# Patient Record
Sex: Female | Born: 1955
Health system: Southern US, Community
[De-identification: ages and names within clinical notes are randomized; demographics above are authoritative.]

## PROBLEM LIST (undated history)

## (undated) DIAGNOSIS — E7800 Pure hypercholesterolemia, unspecified: Secondary | ICD-10-CM

## (undated) DIAGNOSIS — Z9289 Personal history of other medical treatment: Secondary | ICD-10-CM

## (undated) DIAGNOSIS — I10 Essential (primary) hypertension: Secondary | ICD-10-CM

## (undated) HISTORY — PX: EYE SURGERY: SHX253

---

## 1998-02-09 ENCOUNTER — Ambulatory Visit (HOSPITAL_COMMUNITY): Admission: RE | Admit: 1998-02-09 | Discharge: 1998-02-09 | Payer: Self-pay | Admitting: Gynecology

## 1998-02-09 ENCOUNTER — Other Ambulatory Visit: Admission: RE | Admit: 1998-02-09 | Discharge: 1998-02-09 | Payer: Self-pay | Admitting: Gynecology

## 2002-05-27 ENCOUNTER — Other Ambulatory Visit: Admission: RE | Admit: 2002-05-27 | Discharge: 2002-05-27 | Payer: Self-pay | Admitting: Gynecology

## 2003-03-07 ENCOUNTER — Emergency Department (HOSPITAL_COMMUNITY): Admission: AD | Admit: 2003-03-07 | Discharge: 2003-03-07 | Payer: Self-pay | Admitting: Emergency Medicine

## 2005-01-07 ENCOUNTER — Encounter: Admission: RE | Admit: 2005-01-07 | Discharge: 2005-01-07 | Payer: Self-pay | Admitting: Family Medicine

## 2008-03-28 ENCOUNTER — Encounter: Admission: RE | Admit: 2008-03-28 | Discharge: 2008-03-28 | Payer: Self-pay | Admitting: Family Medicine

## 2010-08-25 ENCOUNTER — Encounter: Payer: Self-pay | Admitting: Family Medicine

## 2010-08-26 ENCOUNTER — Encounter: Payer: Self-pay | Admitting: Family Medicine

## 2010-12-11 ENCOUNTER — Other Ambulatory Visit (HOSPITAL_COMMUNITY): Payer: Self-pay | Admitting: Physician Assistant

## 2010-12-11 ENCOUNTER — Ambulatory Visit (HOSPITAL_COMMUNITY)
Admission: RE | Admit: 2010-12-11 | Discharge: 2010-12-11 | Disposition: A | Discharge status: Home / Self Care | Payer: 59 | Source: Ambulatory Visit | Attending: Physician Assistant | Admitting: Physician Assistant

## 2010-12-11 ENCOUNTER — Ambulatory Visit (HOSPITAL_COMMUNITY)
Admission: RE | Admit: 2010-12-11 | Discharge: 2010-12-11 | Disposition: A | Discharge status: Home / Self Care | Payer: 59 | Source: Ambulatory Visit | Attending: Family Medicine | Admitting: Family Medicine

## 2010-12-11 DIAGNOSIS — R05 Cough: Secondary | ICD-10-CM

## 2010-12-11 DIAGNOSIS — J42 Unspecified chronic bronchitis: Secondary | ICD-10-CM | POA: Insufficient documentation

## 2011-10-02 ENCOUNTER — Other Ambulatory Visit: Payer: Self-pay | Admitting: Family Medicine

## 2011-10-02 DIAGNOSIS — Z1231 Encounter for screening mammogram for malignant neoplasm of breast: Secondary | ICD-10-CM

## 2011-10-08 ENCOUNTER — Other Ambulatory Visit: Payer: Self-pay | Admitting: Family Medicine

## 2011-10-08 DIAGNOSIS — Z780 Asymptomatic menopausal state: Secondary | ICD-10-CM

## 2011-10-08 DIAGNOSIS — N951 Menopausal and female climacteric states: Secondary | ICD-10-CM

## 2011-10-15 ENCOUNTER — Ambulatory Visit
Admission: RE | Admit: 2011-10-15 | Discharge: 2011-10-15 | Disposition: A | Discharge status: Home / Self Care | Payer: 59 | Source: Ambulatory Visit | Attending: Family Medicine | Admitting: Family Medicine

## 2011-10-15 DIAGNOSIS — Z1231 Encounter for screening mammogram for malignant neoplasm of breast: Secondary | ICD-10-CM

## 2011-10-29 ENCOUNTER — Ambulatory Visit
Admission: RE | Admit: 2011-10-29 | Discharge: 2011-10-29 | Disposition: A | Discharge status: Home / Self Care | Payer: 59 | Source: Ambulatory Visit | Attending: Family Medicine | Admitting: Family Medicine

## 2011-10-29 DIAGNOSIS — N951 Menopausal and female climacteric states: Secondary | ICD-10-CM

## 2012-09-24 ENCOUNTER — Other Ambulatory Visit (HOSPITAL_COMMUNITY): Payer: Self-pay | Admitting: Family Medicine

## 2012-10-30 ENCOUNTER — Other Ambulatory Visit: Payer: Self-pay | Admitting: Family Medicine

## 2012-10-30 DIAGNOSIS — Z1231 Encounter for screening mammogram for malignant neoplasm of breast: Secondary | ICD-10-CM

## 2012-11-16 ENCOUNTER — Ambulatory Visit
Admission: RE | Admit: 2012-11-16 | Discharge: 2012-11-16 | Disposition: A | Discharge status: Home / Self Care | Payer: BC Managed Care – PPO | Source: Ambulatory Visit | Attending: Family Medicine | Admitting: Family Medicine

## 2012-11-16 DIAGNOSIS — Z1231 Encounter for screening mammogram for malignant neoplasm of breast: Secondary | ICD-10-CM

## 2013-10-12 ENCOUNTER — Other Ambulatory Visit: Payer: Self-pay

## 2013-10-12 DIAGNOSIS — Z1231 Encounter for screening mammogram for malignant neoplasm of breast: Secondary | ICD-10-CM

## 2013-11-18 ENCOUNTER — Ambulatory Visit
Admission: RE | Admit: 2013-11-18 | Discharge: 2013-11-18 | Disposition: A | Discharge status: Home / Self Care | Payer: BC Managed Care – PPO | Source: Ambulatory Visit

## 2013-11-18 DIAGNOSIS — Z1231 Encounter for screening mammogram for malignant neoplasm of breast: Secondary | ICD-10-CM

## 2014-10-21 ENCOUNTER — Other Ambulatory Visit: Payer: Self-pay

## 2014-10-21 DIAGNOSIS — Z1231 Encounter for screening mammogram for malignant neoplasm of breast: Secondary | ICD-10-CM

## 2014-11-21 ENCOUNTER — Ambulatory Visit
Admission: RE | Admit: 2014-11-21 | Discharge: 2014-11-21 | Disposition: A | Discharge status: Home / Self Care | Payer: BLUE CROSS/BLUE SHIELD | Source: Ambulatory Visit

## 2014-11-21 DIAGNOSIS — Z1231 Encounter for screening mammogram for malignant neoplasm of breast: Secondary | ICD-10-CM

## 2015-10-19 ENCOUNTER — Other Ambulatory Visit: Payer: Self-pay | Admitting: Family Medicine

## 2015-10-19 DIAGNOSIS — Z87891 Personal history of nicotine dependence: Secondary | ICD-10-CM

## 2015-11-27 ENCOUNTER — Ambulatory Visit
Admission: RE | Admit: 2015-11-27 | Discharge: 2015-11-27 | Disposition: A | Discharge status: Home / Self Care | Payer: BLUE CROSS/BLUE SHIELD | Source: Ambulatory Visit | Attending: Family Medicine | Admitting: Family Medicine

## 2015-11-27 DIAGNOSIS — Z87891 Personal history of nicotine dependence: Secondary | ICD-10-CM

## 2015-12-01 ENCOUNTER — Telehealth: Payer: Self-pay | Admitting: Cardiology

## 2015-12-01 ENCOUNTER — Telehealth: Payer: Self-pay | Admitting: Cardiovascular Disease

## 2015-12-01 NOTE — Telephone Encounter (Signed)
[facility] 12/11/15 with Dr SwazilandJordan.  [facility] Dr Elvis CoilJordan's schedule on 12/11/15. lp

## 2015-12-11 ENCOUNTER — Ambulatory Visit: Payer: BLUE CROSS/BLUE SHIELD | Admitting: Cardiology

## 2016-01-29 ENCOUNTER — Ambulatory Visit: Payer: BLUE CROSS/BLUE SHIELD | Admitting: Cardiology

## 2016-10-23 ENCOUNTER — Other Ambulatory Visit: Payer: Self-pay | Admitting: Physician Assistant

## 2016-10-23 DIAGNOSIS — R918 Other nonspecific abnormal finding of lung field: Secondary | ICD-10-CM

## 2017-04-09 ENCOUNTER — Other Ambulatory Visit: Payer: Self-pay | Admitting: Physician Assistant

## 2017-04-09 DIAGNOSIS — Z1231 Encounter for screening mammogram for malignant neoplasm of breast: Secondary | ICD-10-CM

## 2017-04-23 ENCOUNTER — Ambulatory Visit: Payer: BLUE CROSS/BLUE SHIELD

## 2018-01-15 ENCOUNTER — Other Ambulatory Visit: Payer: Self-pay | Admitting: Nurse Practitioner

## 2018-01-15 DIAGNOSIS — Z1231 Encounter for screening mammogram for malignant neoplasm of breast: Secondary | ICD-10-CM

## 2018-02-04 ENCOUNTER — Ambulatory Visit
Admission: RE | Admit: 2018-02-04 | Discharge: 2018-02-04 | Disposition: A | Discharge status: Home / Self Care | Payer: BLUE CROSS/BLUE SHIELD | Source: Ambulatory Visit | Attending: Nurse Practitioner | Admitting: Nurse Practitioner

## 2018-02-04 DIAGNOSIS — Z1231 Encounter for screening mammogram for malignant neoplasm of breast: Secondary | ICD-10-CM

## 2018-02-07 ENCOUNTER — Emergency Department: Payer: BLUE CROSS/BLUE SHIELD

## 2018-02-07 ENCOUNTER — Encounter: Payer: Self-pay | Admitting: Emergency Medicine

## 2018-02-07 ENCOUNTER — Inpatient Hospital Stay
Admission: EM | Admit: 2018-02-07 | Discharge: 2018-02-09 | DRG: 494 | Disposition: A | Discharge status: Home Health | Payer: BLUE CROSS/BLUE SHIELD | Attending: Orthopedic Surgery | Admitting: Orthopedic Surgery

## 2018-02-07 DIAGNOSIS — I10 Essential (primary) hypertension: Secondary | ICD-10-CM | POA: Diagnosis present

## 2018-02-07 DIAGNOSIS — S82872A Displaced pilon fracture of left tibia, initial encounter for closed fracture: Secondary | ICD-10-CM | POA: Diagnosis present

## 2018-02-07 DIAGNOSIS — W1842XA Slipping, tripping and stumbling without falling due to stepping into hole or opening, initial encounter: Secondary | ICD-10-CM | POA: Diagnosis present

## 2018-02-07 DIAGNOSIS — S82892A Other fracture of left lower leg, initial encounter for closed fracture: Secondary | ICD-10-CM

## 2018-02-07 DIAGNOSIS — Z79899 Other long term (current) drug therapy: Secondary | ICD-10-CM | POA: Diagnosis not present

## 2018-02-07 DIAGNOSIS — S82899A Other fracture of unspecified lower leg, initial encounter for closed fracture: Secondary | ICD-10-CM | POA: Diagnosis present

## 2018-02-07 DIAGNOSIS — F17200 Nicotine dependence, unspecified, uncomplicated: Secondary | ICD-10-CM | POA: Diagnosis present

## 2018-02-07 DIAGNOSIS — Z8673 Personal history of transient ischemic attack (TIA), and cerebral infarction without residual deficits: Secondary | ICD-10-CM | POA: Diagnosis not present

## 2018-02-07 DIAGNOSIS — E7800 Pure hypercholesterolemia, unspecified: Secondary | ICD-10-CM | POA: Diagnosis present

## 2018-02-07 DIAGNOSIS — J449 Chronic obstructive pulmonary disease, unspecified: Secondary | ICD-10-CM | POA: Diagnosis present

## 2018-02-07 DIAGNOSIS — S82852A Displaced trimalleolar fracture of left lower leg, initial encounter for closed fracture: Principal | ICD-10-CM | POA: Diagnosis present

## 2018-02-07 DIAGNOSIS — T148XXA Other injury of unspecified body region, initial encounter: Secondary | ICD-10-CM

## 2018-02-07 HISTORY — DX: Pure hypercholesterolemia, unspecified: E78.00

## 2018-02-07 HISTORY — DX: Essential (primary) hypertension: I10

## 2018-02-07 MED ORDER — ETOMIDATE 2 MG/ML IV SOLN
INTRAVENOUS | Status: AC | PRN
  Administered 2018-02-07 (×2): 6 mg via INTRAVENOUS

## 2018-02-07 MED ORDER — ETOMIDATE 2 MG/ML IV SOLN
INTRAVENOUS | Status: AC
  Filled 2018-02-07: qty 10

## 2018-02-07 MED ORDER — ONDANSETRON HCL 4 MG/2ML IJ SOLN
INTRAMUSCULAR | Status: AC
  Administered 2018-02-07: 4 mg
  Filled 2018-02-07: qty 2

## 2018-02-07 NOTE — ED Provider Notes (Signed)
.Sedation Date/Time: 02/07/2018 11:32 PM Performed by: Rebecka ApleyWebster, Graylee Arutyunyan P, MD Authorized by: Rebecka ApleyWebster, Korissa Horsford P, MD   Consent:    Consent obtained:  Written (electronic informed consent)   Consent given by:  Patient   Risks discussed:  Allergic reaction, dysrhythmia, inadequate sedation, nausea, vomiting, respiratory compromise necessitating ventilatory assistance and intubation, prolonged sedation necessitating reversal and prolonged hypoxia resulting in organ damage Universal protocol:    Procedure explained and questions answered to patient or proxy's satisfaction: yes     Relevant documents present and verified: yes     Test results available and properly labeled: yes     Imaging studies available: yes     Required blood products, implants, devices, and special equipment available: yes     Immediately prior to procedure a time out was called: yes     Patient identity confirmation method:  Arm band and verbally with patient Indications:    Procedure performed:  Dislocation reduction   Procedure necessitating sedation performed by: physician assistant.   Intended level of sedation:  Moderate (conscious sedation) Pre-sedation assessment:    Time since last food or drink:  2200   NPO status caution: urgency dictates proceeding with non-ideal NPO status     ASA classification: class 2 - patient with mild systemic disease     Neck mobility: normal     Mouth opening:  3 or more finger widths   Thyromental distance:  3 finger widths   Mallampati score:  II - soft palate, uvula, fauces visible   Pre-sedation assessments completed and reviewed: airway patency, cardiovascular function, mental status, pain level and respiratory function     Pre-sedation assessment completed:  02/07/2018 11:20 PM Immediate pre-procedure details:    Reassessment: Patient reassessed immediately prior to procedure     Reviewed: vital signs, relevant labs/tests and NPO status     Verified: bag valve mask available,  emergency equipment available, intubation equipment available, IV patency confirmed, oxygen available, reversal medications available and suction available   Procedure details (see MAR for exact dosages):    Preoxygenation:  Nasal cannula   Sedation:  Etomidate   Intra-procedure monitoring:  Blood pressure monitoring, continuous pulse oximetry, cardiac monitor, frequent vital sign checks, frequent LOC assessments and continuous capnometry   Intra-procedure events: none     Total Provider sedation time (minutes):  10 Post-procedure details:    Post-sedation assessment completed:  02/08/2018 12:12 AM   Attendance: Constant attendance by certified staff until patient recovered     Recovery: Patient returned to pre-procedure baseline     Post-sedation assessments completed and reviewed: airway patency, cardiovascular function, hydration status, mental status and respiratory function     Patient is stable for discharge or admission: yes     Patient tolerance:  Tolerated well, no immediate complications Reduction of dislocation Date/Time: 02/07/2018 11:32 PM Performed by: Racheal Patchesuthriell, Jonathan D, PA-C Authorized by: Rebecka ApleyWebster, Kobe Ofallon P, MD  Consent: Verbal consent obtained. Written consent obtained. Risks and benefits: risks, benefits and alternatives were discussed Consent given by: patient Patient understanding: patient states understanding of the procedure being performed Patient consent: the patient's understanding of the procedure matches consent given Procedure consent: procedure consent matches procedure scheduled Relevant documents: relevant documents present and verified Test results: test results available and properly labeled Site marked: the operative site was marked Imaging studies: imaging studies available Patient identity confirmed: verbally with patient and arm band Local anesthesia used: no  Anesthesia: Local anesthesia used: no  Sedation: Patient sedated: yes Sedation type:  moderate (conscious) sedation Sedatives: etomidate Sedation start date/time: 02/07/2018 11:32 PM Sedation end date/time: 02/07/2018 11:45 PM Vitals: Vital signs were monitored during sedation.  Patient tolerance: Patient tolerated the procedure well with no immediate complications    Medical screening examination/treatment/procedure(s) were conducted as a shared visit with non-physician practitioner(s) and myself.  I personally evaluated the patient during the encounter.     Rebecka Apley, MD 02/08/18 [phone]

## 2018-02-07 NOTE — ED Triage Notes (Signed)
Patient states that she stepped in a hole and twisted her left ankle. Patient with pain and swelling to left ankle.

## 2018-02-07 NOTE — ED Provider Notes (Signed)
[facility] Emergency Department Provider Note  ____________________________________________  Time seen: Approximately 10:39 PM  I have reviewed the triage vital signs and the nursing notes.   HISTORY  Chief Complaint Ankle Pain    HPI Emily Bishop is a 62 y.o. female who presents the emergency department complaining of left ankle pain and injury.  Patient reports she stepped either into a hole or caught her sandal on an object, twisting it causing her to fall.  Patient has not been able to bear weight on the ankle since time of injury.  She reports that she has also been unable to move her ankle since time of injury.  She endorses sensation to her toes, but states that they feel cold.  She reports gross swelling to the ankle.  No previous history of injury or surgery to the ankle.  Patient did not hit her head or lose consciousness.  Patient reports that while painful while not moving her ankle it is relatively pain-free.   She has not taken any medications including Tylenol or Motrin prior to arrival.  Patient reports that she had been consuming alcohol and had eaten approximately 2 hours prior to injury.  Patient continued to drink nonalcoholic beverages up to the time of injury.  Patient has a history of hypercholesterolemia, hypertension, COPD.  She reports that these are all well managed with primary care.   Past Medical History:  Diagnosis Date  . Hypercholesteremia   . Hypertension     Patient Active Problem List   Diagnosis Date Noted  . Ankle fracture 02/07/2018    History reviewed. No pertinent surgical history.  Prior to Admission medications   Not on File    Allergies Patient has no known allergies.  No family history on file.  Social History Social History   Tobacco Use  . Smoking status: Current Every Day Smoker  . Smokeless tobacco: Never Used  Substance Use Topics  . Alcohol use: Not on file  . Drug use: Not on file      Review of Systems  Constitutional: No fever/chills Eyes: No visual changes. Cardiovascular: no chest pain. Respiratory: no cough. No SOB. Gastrointestinal: No abdominal pain.  No nausea, no vomiting.   Musculoskeletal: Positive for left ankle pain and injury Skin: Negative for rash, abrasions, lacerations, ecchymosis. Neurological: Negative for headaches, focal weakness or numbness. 10-point ROS otherwise negative.  ____________________________________________   PHYSICAL EXAM:  VITAL SIGNS: ED Triage Vitals  Enc Vitals Group     BP 02/07/18 2231 (!) 153/85     Pulse Rate 02/07/18 2231 (!) 108     Resp 02/07/18 2231 18     Temp 02/07/18 2231 98.3 F (36.8 C)     Temp Source 02/07/18 2231 Oral     SpO2 02/07/18 2231 96 %     Weight 02/07/18 2232 145 lb (65.8 kg)     Height 02/07/18 2232 5' 6.5" (1.689 m)     Head Circumference --      Peak Flow --      Pain Score 02/07/18 2232 8     Pain Loc --      Pain Edu? --      Excl. in GC? --      Constitutional: Alert and oriented. Well appearing and in no acute distress. Eyes: Conjunctivae are normal. PERRL. EOMI. Head: Atraumatic. Neck: No stridor.    Cardiovascular: Normal rate, regular rhythm. Normal S1 and S2.  Good peripheral circulation. Respiratory: Normal respiratory effort  without tachypnea or retractions. Lungs CTAB. Good air entry to the bases with no decreased or absent breath sounds. Musculoskeletal: Full range of motion to all extremities. No gross deformities appreciated.  Visualization of the left ankle reveals gross deformity to the lateral and anterior ankle.  Patient with no range of motion to the ankle at this time.  Patient is exquisitely tender to palpation with palpable deformity to the anterior lateral aspect of the ankle.  Dorsalis pedis pulse still intact.  Is reduced to all 5 digits.  Patient's distal foot is slightly dusky.  From mid metatarsal region distally, area is cool to touch.  Sensation  intact all digits. Neurologic:  Normal speech and language. No gross focal neurologic deficits are appreciated.  Skin:  Skin is warm, dry and intact. No rash noted. Psychiatric: Mood and affect are normal. Speech and behavior are normal. Patient exhibits appropriate insight and judgement.   ____________________________________________   LABS (all labs ordered are listed, but only abnormal results are displayed)  Labs Reviewed  COMPREHENSIVE METABOLIC PANEL  CBC WITH DIFFERENTIAL/PLATELET  PROTIME-INR  APTT   ____________________________________________  EKG   ____________________________________________  RADIOLOGY I personally viewed and evaluated these images as part of my medical decision making, as well as reviewing the written report by the radiologist.  Patient with acute, displaced and angulated fracture of the distal fibula with posterior malleolar fracture as well.  Dislocation of the talar dome.  Dg Chest 1 View  Result Date: 02/08/2018 CLINICAL DATA:  Initial preoperative evaluation for left ankle ORIF. EXAM: CHEST  1 VIEW COMPARISON:  Prior CT from 11/27/2015. FINDINGS: Cardiac and mediastinal silhouettes are within normal limits. Aortic atherosclerosis noted. Lungs are well inflated. No focal infiltrates. No pulmonary edema or pleural effusion. No pneumothorax. No acute osseous abnormality. IMPRESSION: 1. No active cardiopulmonary disease. 2. Aortic atherosclerosis. Electronically Signed   By: Rise MuBenjamin  McClintock M.D.   On: 02/08/2018 00:00   Dg Ankle Complete Left  Result Date: 02/07/2018 CLINICAL DATA:  Follow-up examination status post splinting and reduction. EXAM: LEFT ANKLE COMPLETE - 3+ VIEW COMPARISON:  Prior radiograph from earlier the same day. FINDINGS: Splinting material overlies the left ankle, somewhat limiting evaluation for fine osseous detail. Previously seen fracture/dislocation of the left ankle has been reduced. Distal left fibular fracture in near  anatomic alignment. Persistent slight posterior and superior displacement of a posterior malleolar fracture. Tibiotalar joint now in anatomic alignment. Ankle mortise is approximated. No other new osseous abnormality about the ankle. IMPRESSION: 1. Left ankle joint in gross anatomic alignment status post splinting and reduction. 2. Distal left fibular fracture in anatomic alignment. 3. Persistent slight posterior and superior displacement of a posterior malleolar fracture. Electronically Signed   By: Rise MuBenjamin  McClintock M.D.   On: 02/07/2018 23:57   Dg Ankle Complete Left  Result Date: 02/07/2018 CLINICAL DATA:  Stepped in hole EXAM: LEFT ANKLE COMPLETE - 3+ VIEW COMPARISON:  None. FINDINGS: Acute fracture distal shaft and metaphysis of the fibula with 1 bone with of lateral and posterior displacement of distal fracture fragment. Mild to moderate lateral angulation of distal fracture fragment. Posterior dislocation of the talus with respect to the distal tibia and fibula. Suspicion of posterior malleolar fracture. Widening of the anterior joint. Small plantar calcaneal spur. IMPRESSION: 1. Acute displaced and angulated fracture of the distal fibula 2. Posterior dislocation of the talar dome with respect to the distal tibia and fibula. Suspect that there is displaced posterior malleolar fracture. Electronically Signed   By:  Jasmine Pang M.D.   On: 02/07/2018 22:49    ____________________________________________    PROCEDURES  Procedure(s) performed:    Reduction of fracture Date/Time: 02/07/2018 11:57 PM Performed by: Racheal Patches, PA-C Authorized by: Racheal Patches, PA-C  Consent: Verbal consent obtained. Written consent obtained. Risks and benefits: risks, benefits and alternatives were discussed Consent given by: patient Patient understanding: patient states understanding of the procedure being performed Patient consent: the patient's understanding of the procedure matches  consent given Imaging studies: imaging studies available Required items: required blood products, implants, devices, and special equipment available Patient identity confirmed: verbally with patient Time out: Immediately prior to procedure a "time out" was called to verify the correct patient, procedure, equipment, support staff and site/side marked as required. Local anesthesia used: no  Anesthesia: Local anesthesia used: no  Sedation: Patient sedated: yes Sedation type: moderate (conscious) sedation Sedatives: etomidate Vitals: Vital signs were monitored during sedation.  Patient tolerance: Patient tolerated the procedure well with no immediate complications Comments: Reduction is performed by using distal traction to the great toe with manipulation of the calcaneus.  Palpable reduction is appreciated with good visible improvement.  Patient's distal foot and toes immediately started to pink up.  Dorsalis pedis pulse intact.  As patient aroused from sedation, she reports good sensation and improvement of pain and coolness to the foot.  Marland KitchenSplint Application Date/Time: 02/07/2018 11:59 PM Performed by: Racheal Patches, PA-C Authorized by: Racheal Patches, PA-C   Consent:    Consent obtained:  Verbal   Consent given by:  Patient   Risks discussed:  Pain and swelling Pre-procedure details:    Sensation:  Normal   Skin color:  Pink Procedure details:    Laterality:  Left   Location:  Ankle   Ankle:  L ankle   Splint type:  Short leg and ankle stirrup   Supplies:  Cotton padding, Ortho-Glass and elastic bandage Post-procedure details:    Pain:  Improved   Sensation:  Normal   Skin color:  Pink   Patient tolerance of procedure:  Tolerated well, no immediate complications      Medications  etomidate (AMIDATE) injection (6 mg Intravenous Given 02/07/18 2337)  ondansetron (ZOFRAN) 4 MG/2ML injection (4 mg  Given 02/07/18 2345)      ____________________________________________   INITIAL IMPRESSION / ASSESSMENT AND PLAN / ED COURSE  Pertinent labs & imaging results that were available during my care of the patient were reviewed by me and considered in my medical decision making (see chart for details).  Review of the [place] CSRS was performed in accordance of the [facility] prior to dispensing any controlled drugs.  Clinical Course as of Feb 08 6  Sat Feb 07, 2018  2355 Patient resented to the emergency department with obvious left ankle injury.  Patient reports that she either stepped into a hole or caught her sandal causing her to twist her ankle.  Nonweightbearing since injury.  On exam, grossly deformed ankle injury was appreciated with palpable abnormalities.  Patient had duskiness of the foot extending past the mid metatarsal region.  Capillary refill is less than 4 seconds.  Foot and toes to the distal aspect were cool to the touch.  Sensation intact.  Patient had consumed alcohol but appeared competent to make decisions for herself.  She did not hit her head or lose consciousness.  Initial x-ray reveals trimalleolar fracture with talar dome dislocation.  I discussed the case with on-call orthopedic surgeon, Dr. Allena Katz who  recommends closed reduction tonight, splint with posterior OCL and stirrup ankle splint.  CT after reduction and surgery in the morning.  Accordingly, patient is moved to the major side emergency department for conscious sedation.  Dr. Zenda Alpers performs conscious sedation while I performed reduction of the fracture dislocation.  Patient tolerated well.  See Dr. Leone Payor note for procedure details.   [JC]    Clinical Course User Index [JC] Cuthriell, Delorise Royals, PA-C     Patient's diagnosis is consistent with trimalleolar fracture of the left ankle.  Patient presented with obviously deformed left ankle injury.  X-ray revealed trimalleolar fracture with dislocation of the talar dome.  Patient was moved  to the major side of the emergency department for closer monitoring during procedural sedation for reduction.  This was performed with good results.  Patient is neurovascularly intact status post reduction.  Patient did have some mild compromise of circulation prior to dislocation with a cat refill of 4 seconds, dusky foot.  This immediately improved after reduction.  Patient also reports good symptomatic improvement as well.  I discussed the case with on-call orthopedic surgeon who advises that he will perform surgery tomorrow.  In the interim, patient is to have basic blood work, chest x-ray, CT of the left ankle for further evaluation postreduction.  After reduction, patient care is turned over to Dr. Zenda Alpers until patient is admitted.   ____________________________________________  FINAL CLINICAL IMPRESSION(S) / ED DIAGNOSES  Final diagnoses:  Closed trimalleolar fracture of left ankle, initial encounter      NEW MEDICATIONS STARTED DURING THIS VISIT:  ED Discharge Orders    None          This chart was dictated using voice recognition software/Dragon. Despite best efforts to proofread, errors can occur which can change the meaning. Any change was purely unintentional.    Racheal Patches, PA-C 02/08/18 0007    Rebecka Apley, MD 02/08/18 [phone]

## 2018-02-08 ENCOUNTER — Encounter: Payer: Self-pay | Admitting: Anesthesiology

## 2018-02-08 ENCOUNTER — Inpatient Hospital Stay: Payer: BLUE CROSS/BLUE SHIELD

## 2018-02-08 ENCOUNTER — Encounter
Admission: EM | Disposition: A | Discharge status: Home Health | Payer: Self-pay | Source: Home / Self Care | Attending: Orthopedic Surgery

## 2018-02-08 ENCOUNTER — Other Ambulatory Visit: Payer: Self-pay

## 2018-02-08 ENCOUNTER — Inpatient Hospital Stay: Payer: BLUE CROSS/BLUE SHIELD | Admitting: Anesthesiology

## 2018-02-08 HISTORY — PX: ORIF ANKLE FRACTURE: SHX5408

## 2018-02-08 LAB — COMPREHENSIVE METABOLIC PANEL
ALT: 16 U/L (ref 0–44)
AST: 24 U/L (ref 15–41)
Albumin: 3.6 g/dL (ref 3.5–5.0)
Alkaline Phosphatase: 61 U/L (ref 38–126)
Anion gap: 10 (ref 5–15)
BILIRUBIN TOTAL: 0.4 mg/dL (ref 0.3–1.2)
CO2: 21 mmol/L — ABNORMAL LOW (ref 22–32)
CREATININE: 0.59 mg/dL (ref 0.44–1.00)
Calcium: 8.3 mg/dL — ABNORMAL LOW (ref 8.9–10.3)
Chloride: 101 mmol/L (ref 98–111)
GFR calc Af Amer: >60 mL/min (ref >60)
Glucose, Bld: 104 mg/dL — ABNORMAL HIGH (ref 70–99)
Potassium: 3.7 mmol/L (ref 3.5–5.1)
Sodium: 132 mmol/L — ABNORMAL LOW (ref 135–145)
TOTAL PROTEIN: 6 g/dL — AB (ref 6.5–8.1)

## 2018-02-08 LAB — CBC WITH DIFFERENTIAL/PLATELET
BASOS ABS: 0 10*3/uL (ref 0–0.1)
Basophils Relative: 1 %
EOS ABS: 0 10*3/uL (ref 0–0.7)
EOS PCT: 0 %
HCT: 43 % (ref 35.0–47.0)
Hemoglobin: 15.3 g/dL (ref 12.0–16.0)
Lymphocytes Relative: 22 %
Lymphs Abs: 1.6 10*3/uL (ref 1.0–3.6)
MCH: 35.2 pg — AB (ref 26.0–34.0)
MCHC: 35.5 g/dL (ref 32.0–36.0)
MCV: 99 fL (ref 80.0–100.0)
Monocytes Absolute: 0.3 10*3/uL (ref 0.2–0.9)
Monocytes Relative: 5 %
Neutro Abs: 5.3 10*3/uL (ref 1.4–6.5)
Neutrophils Relative %: 72 %
PLATELETS: 175 10*3/uL (ref 150–440)
RBC: 4.34 MIL/uL (ref 3.80–5.20)
RDW: 14.4 % (ref 11.5–14.5)
WBC: 7.3 10*3/uL (ref 3.6–11.0)

## 2018-02-08 LAB — MRSA PCR SCREENING: MRSA by PCR: NEGATIVE

## 2018-02-08 LAB — PROTIME-INR
INR: 0.87
PROTHROMBIN TIME: 11.7 s (ref 11.4–15.2)

## 2018-02-08 LAB — APTT: aPTT: 30 seconds (ref 24–36)

## 2018-02-08 SURGERY — OPEN REDUCTION INTERNAL FIXATION (ORIF) ANKLE FRACTURE
Anesthesia: General | Site: Ankle | Laterality: Left | Wound class: Clean

## 2018-02-08 MED ORDER — DIPHENHYDRAMINE HCL 12.5 MG/5ML PO ELIX: 12.5000 mg | ORAL_SOLUTION | ORAL | Status: DC | PRN

## 2018-02-08 MED ORDER — ACETAMINOPHEN 500 MG PO TABS
1000.0000 mg | ORAL_TABLET | Freq: Three times a day (TID) | ORAL | Status: DC
  Administered 2018-02-08 – 2018-02-09 (×3): 1000 mg via ORAL
  Filled 2018-02-08 (×3): qty 2

## 2018-02-08 MED ORDER — BISACODYL 10 MG RE SUPP: 10.0000 mg | Freq: Every day | RECTAL | Status: DC | PRN

## 2018-02-08 MED ORDER — HYDROMORPHONE HCL 1 MG/ML IJ SOLN: 0.2500 mg | INTRAMUSCULAR | Status: DC | PRN

## 2018-02-08 MED ORDER — GLYCOPYRROLATE 0.2 MG/ML IJ SOLN
INTRAMUSCULAR | Status: AC
  Filled 2018-02-08: qty 1

## 2018-02-08 MED ORDER — ACETAMINOPHEN 500 MG PO TABS
1000.0000 mg | ORAL_TABLET | Freq: Four times a day (QID) | ORAL | Status: DC
  Administered 2018-02-08 (×2): 1000 mg via ORAL
  Filled 2018-02-08 (×2): qty 2

## 2018-02-08 MED ORDER — OXYCODONE HCL 5 MG PO TABS: 5.0000 mg | ORAL_TABLET | Freq: Once | ORAL | Status: DC | PRN

## 2018-02-08 MED ORDER — NEOMYCIN-POLYMYXIN B GU 40-200000 IR SOLN
Status: AC
  Filled 2018-02-08: qty 2

## 2018-02-08 MED ORDER — ONDANSETRON HCL 4 MG/2ML IJ SOLN: 4.0000 mg | Freq: Four times a day (QID) | INTRAMUSCULAR | Status: DC | PRN

## 2018-02-08 MED ORDER — SENNOSIDES-DOCUSATE SODIUM 8.6-50 MG PO TABS: 1.0000 | ORAL_TABLET | Freq: Every evening | ORAL | Status: DC | PRN

## 2018-02-08 MED ORDER — DOCUSATE SODIUM 100 MG PO CAPS
100.0000 mg | ORAL_CAPSULE | Freq: Two times a day (BID) | ORAL | Status: DC
  Administered 2018-02-09: 100 mg via ORAL
  Filled 2018-02-08: qty 1

## 2018-02-08 MED ORDER — DOCUSATE SODIUM 100 MG PO CAPS: 100.0000 mg | ORAL_CAPSULE | Freq: Two times a day (BID) | ORAL | Status: DC

## 2018-02-08 MED ORDER — CEFAZOLIN SODIUM-DEXTROSE 2-3 GM-%(50ML) IV SOLR
INTRAVENOUS | Status: DC | PRN
  Administered 2018-02-08: 2 g via INTRAVENOUS

## 2018-02-08 MED ORDER — FENTANYL CITRATE (PF) 100 MCG/2ML IJ SOLN
INTRAMUSCULAR | Status: AC
  Filled 2018-02-08: qty 2

## 2018-02-08 MED ORDER — METHOCARBAMOL 1000 MG/10ML IJ SOLN
500.0000 mg | Freq: Four times a day (QID) | INTRAVENOUS | Status: DC | PRN
  Filled 2018-02-08: qty 5

## 2018-02-08 MED ORDER — OXYCODONE HCL 5 MG PO TABS
5.0000 mg | ORAL_TABLET | ORAL | Status: DC | PRN
  Administered 2018-02-08 (×2): 10 mg via ORAL
  Filled 2018-02-08 (×2): qty 2

## 2018-02-08 MED ORDER — ONDANSETRON HCL 4 MG/2ML IJ SOLN
INTRAMUSCULAR | Status: AC
  Filled 2018-02-08: qty 2

## 2018-02-08 MED ORDER — ONDANSETRON HCL 4 MG/2ML IJ SOLN
4.0000 mg | Freq: Four times a day (QID) | INTRAMUSCULAR | Status: DC | PRN
  Administered 2018-02-08: 4 mg via INTRAVENOUS

## 2018-02-08 MED ORDER — BUPIVACAINE-EPINEPHRINE (PF) 0.25% -1:200000 IJ SOLN
INTRAMUSCULAR | Status: AC
  Filled 2018-02-08: qty 30

## 2018-02-08 MED ORDER — MIDAZOLAM HCL 2 MG/2ML IJ SOLN
INTRAMUSCULAR | Status: DC | PRN
  Administered 2018-02-08: 2 mg via INTRAVENOUS

## 2018-02-08 MED ORDER — LIDOCAINE HCL (CARDIAC) PF 100 MG/5ML IV SOSY
PREFILLED_SYRINGE | INTRAVENOUS | Status: DC | PRN
  Administered 2018-02-08: 100 mg via INTRAVENOUS

## 2018-02-08 MED ORDER — ACETAMINOPHEN 10 MG/ML IV SOLN
INTRAVENOUS | Status: AC
  Filled 2018-02-08: qty 100

## 2018-02-08 MED ORDER — CEFAZOLIN SODIUM-DEXTROSE 1-4 GM/50ML-% IV SOLN
1.0000 g | Freq: Four times a day (QID) | INTRAVENOUS | Status: AC
  Administered 2018-02-08 – 2018-02-09 (×3): 1 g via INTRAVENOUS
  Filled 2018-02-08 (×3): qty 50

## 2018-02-08 MED ORDER — DEXAMETHASONE SODIUM PHOSPHATE 10 MG/ML IJ SOLN
INTRAMUSCULAR | Status: AC
  Filled 2018-02-08: qty 1

## 2018-02-08 MED ORDER — SODIUM CHLORIDE 0.9 % IR SOLN
Status: DC | PRN
  Administered 2018-02-08: 500 mL

## 2018-02-08 MED ORDER — FENTANYL CITRATE (PF) 100 MCG/2ML IJ SOLN
25.0000 ug | INTRAMUSCULAR | Status: DC | PRN
  Administered 2018-02-08 (×2): 50 ug via INTRAVENOUS

## 2018-02-08 MED ORDER — FENTANYL CITRATE (PF) 100 MCG/2ML IJ SOLN
INTRAMUSCULAR | Status: DC | PRN
  Administered 2018-02-08 (×2): 50 ug via INTRAVENOUS

## 2018-02-08 MED ORDER — PROPOFOL 10 MG/ML IV BOLUS
INTRAVENOUS | Status: DC | PRN
  Administered 2018-02-08: 150 mg via INTRAVENOUS
  Administered 2018-02-08: 50 mg via INTRAVENOUS

## 2018-02-08 MED ORDER — ONDANSETRON HCL 4 MG PO TABS
4.0000 mg | ORAL_TABLET | Freq: Four times a day (QID) | ORAL | Status: DC | PRN
Start: 2018-02-08 — End: 2018-02-09
  Administered 2018-02-09: 4 mg via ORAL
  Filled 2018-02-08: qty 1

## 2018-02-08 MED ORDER — LACTATED RINGERS IV SOLN
INTRAVENOUS | Status: DC | PRN
  Administered 2018-02-08: 10:00:00 via INTRAVENOUS

## 2018-02-08 MED ORDER — PHENYLEPHRINE HCL 10 MG/ML IJ SOLN
INTRAMUSCULAR | Status: DC | PRN
  Administered 2018-02-08 (×6): 100 ug via INTRAVENOUS

## 2018-02-08 MED ORDER — FLEET ENEMA 7-19 GM/118ML RE ENEM: 1.0000 | ENEMA | Freq: Once | RECTAL | Status: DC | PRN

## 2018-02-08 MED ORDER — SUGAMMADEX SODIUM 200 MG/2ML IV SOLN
INTRAVENOUS | Status: AC
Start: 2018-02-08 — End: ?
  Filled 2018-02-08: qty 2

## 2018-02-08 MED ORDER — SUGAMMADEX SODIUM 200 MG/2ML IV SOLN
INTRAVENOUS | Status: DC | PRN
  Administered 2018-02-08: 150 mg via INTRAVENOUS

## 2018-02-08 MED ORDER — HYDROMORPHONE HCL 1 MG/ML IJ SOLN: 0.5000 mg | INTRAMUSCULAR | Status: DC | PRN

## 2018-02-08 MED ORDER — METHOCARBAMOL 500 MG PO TABS
500.0000 mg | ORAL_TABLET | Freq: Four times a day (QID) | ORAL | Status: DC | PRN
  Administered 2018-02-08 – 2018-02-09 (×3): 500 mg via ORAL
  Filled 2018-02-08 (×3): qty 1

## 2018-02-08 MED ORDER — TRAMADOL HCL 50 MG PO TABS
50.0000 mg | ORAL_TABLET | Freq: Four times a day (QID) | ORAL | Status: DC | PRN
  Administered 2018-02-08 – 2018-02-09 (×2): 50 mg via ORAL
  Filled 2018-02-08 (×2): qty 1

## 2018-02-08 MED ORDER — ASPIRIN EC 325 MG PO TBEC
325.0000 mg | DELAYED_RELEASE_TABLET | Freq: Two times a day (BID) | ORAL | Status: DC
  Administered 2018-02-09: 325 mg via ORAL
  Filled 2018-02-08: qty 1

## 2018-02-08 MED ORDER — OXYCODONE HCL 5 MG PO TABS
10.0000 mg | ORAL_TABLET | ORAL | Status: DC | PRN
  Administered 2018-02-08 (×2): 10 mg via ORAL
  Filled 2018-02-08 (×2): qty 2

## 2018-02-08 MED ORDER — PROPOFOL 10 MG/ML IV BOLUS
INTRAVENOUS | Status: AC
Start: 2018-02-08 — End: ?
  Filled 2018-02-08: qty 20

## 2018-02-08 MED ORDER — GLYCOPYRROLATE 0.2 MG/ML IJ SOLN
INTRAMUSCULAR | Status: DC | PRN
  Administered 2018-02-08: .2 mg via INTRAVENOUS

## 2018-02-08 MED ORDER — ACETAMINOPHEN 10 MG/ML IV SOLN
INTRAVENOUS | Status: DC | PRN
  Administered 2018-02-08: 1000 mg via INTRAVENOUS

## 2018-02-08 MED ORDER — DEXAMETHASONE SODIUM PHOSPHATE 10 MG/ML IJ SOLN
INTRAMUSCULAR | Status: DC | PRN
  Administered 2018-02-08: 10 mg via INTRAVENOUS

## 2018-02-08 MED ORDER — ONDANSETRON HCL 4 MG PO TABS: 4.0000 mg | ORAL_TABLET | Freq: Four times a day (QID) | ORAL | Status: DC | PRN

## 2018-02-08 MED ORDER — SODIUM CHLORIDE 0.9 % IV SOLN
INTRAVENOUS | Status: DC
  Administered 2018-02-08: 13:00:00 via INTRAVENOUS

## 2018-02-08 MED ORDER — ROCURONIUM BROMIDE 100 MG/10ML IV SOLN
INTRAVENOUS | Status: DC | PRN
  Administered 2018-02-08: 30 mg via INTRAVENOUS

## 2018-02-08 MED ORDER — OXYCODONE HCL 5 MG PO TABS
5.0000 mg | ORAL_TABLET | ORAL | Status: DC | PRN
  Administered 2018-02-08 – 2018-02-09 (×3): 5 mg via ORAL
  Filled 2018-02-08 (×3): qty 1

## 2018-02-08 MED ORDER — ALBUTEROL SULFATE (2.5 MG/3ML) 0.083% IN NEBU
2.5000 mg | INHALATION_SOLUTION | RESPIRATORY_TRACT | Status: DC | PRN
  Administered 2018-02-08: 2.5 mg via RESPIRATORY_TRACT
  Filled 2018-02-08: qty 3

## 2018-02-08 MED ORDER — OXYCODONE HCL 5 MG/5ML PO SOLN: 5.0000 mg | Freq: Once | ORAL | Status: DC | PRN

## 2018-02-08 MED ORDER — CEFAZOLIN SODIUM 1 G IJ SOLR
INTRAMUSCULAR | Status: AC
  Filled 2018-02-08: qty 20

## 2018-02-08 MED ORDER — MIDAZOLAM HCL 2 MG/2ML IJ SOLN
INTRAMUSCULAR | Status: AC
  Filled 2018-02-08: qty 2

## 2018-02-08 SURGICAL SUPPLY — 55 items
BANDAGE ACE 4X5 VEL STRL LF (GAUZE/BANDAGES/DRESSINGS) ×1 IMPLANT
BANDAGE ACE 6X5 VEL STRL LF (GAUZE/BANDAGES/DRESSINGS) ×5 IMPLANT
BANDAGE ELASTIC 6 LF NS (GAUZE/BANDAGES/DRESSINGS) ×2 IMPLANT
BLADE SURG 15 STRL LF DISP TIS (BLADE) ×2 IMPLANT
BLADE SURG 15 STRL SS (BLADE) ×6
BLADE SURG SZ10 CARB STEEL (BLADE) ×3 IMPLANT
BNDG CMPR MED 5X6 ELC HKLP NS (GAUZE/BANDAGES/DRESSINGS) ×1
BNDG COHESIVE 4X5 TAN STRL (GAUZE/BANDAGES/DRESSINGS) ×2 IMPLANT
BNDG ESMARK 6X12 TAN STRL LF (GAUZE/BANDAGES/DRESSINGS) ×1 IMPLANT
CANISTER SUCT 1200ML W/VALVE (MISCELLANEOUS) ×3 IMPLANT
CAST PADDING 6X4YD ST 30248 (SOFTGOODS) ×2
CHLORAPREP W/TINT 26ML (MISCELLANEOUS) ×3 IMPLANT
CUFF TOURN 24 STER (MISCELLANEOUS) IMPLANT
CUFF TOURN 30 STER DUAL PORT (MISCELLANEOUS) IMPLANT
DRAPE C-ARM XRAY 36X54 (DRAPES) ×1 IMPLANT
DRAPE C-ARMOR (DRAPES) ×3 IMPLANT
DRAPE U-SHAPE 47X51 STRL (DRAPES) ×3 IMPLANT
ELECT CAUTERY BLADE 6.4 (BLADE) ×3 IMPLANT
ELECT REM PT RETURN 9FT ADLT (ELECTROSURGICAL) ×3
ELECTRODE REM PT RTRN 9FT ADLT (ELECTROSURGICAL) ×1 IMPLANT
GAUZE PETRO XEROFOAM 1X8 (MISCELLANEOUS) ×5 IMPLANT
GAUZE SPONGE 4X4 12PLY STRL (GAUZE/BANDAGES/DRESSINGS) ×3 IMPLANT
GLOVE BIOGEL PI IND STRL 8 (GLOVE) ×1 IMPLANT
GLOVE BIOGEL PI INDICATOR 8 (GLOVE) ×2
GLOVE SURG SYN 7.5  E (GLOVE) ×10
GLOVE SURG SYN 7.5 E (GLOVE) ×5 IMPLANT
GLOVE SURG SYN 7.5 PF PI (GLOVE) ×2 IMPLANT
GOWN STRL REUS W/ TWL LRG LVL3 (GOWN DISPOSABLE) ×1 IMPLANT
GOWN STRL REUS W/ TWL XL LVL3 (GOWN DISPOSABLE) ×1 IMPLANT
GOWN STRL REUS W/TWL LRG LVL3 (GOWN DISPOSABLE) ×3
GOWN STRL REUS W/TWL XL LVL3 (GOWN DISPOSABLE) ×6
KIT TURNOVER KIT A (KITS) ×3 IMPLANT
NS IRRIG 1000ML POUR BTL (IV SOLUTION) ×3 IMPLANT
PACK EXTREMITY ARMC (MISCELLANEOUS) ×3 IMPLANT
PAD CAST CTTN 4X4 STRL (SOFTGOODS) ×1 IMPLANT
PADDING CAST COTTON 4X4 STRL (SOFTGOODS) ×12
PADDING CAST COTTON 6X4 ST (SOFTGOODS) ×1 IMPLANT
PIN APEX 5X150 (EXFIX) ×4 IMPLANT
PIN APEX EXFIX (EXFIX) ×2 IMPLANT
PIN CLAMP 5H 30DEG POST (EXFIX) ×2 IMPLANT
PIN TO ROD COUPLING EXFIX (EXFIX) ×6 IMPLANT
PIN TRANSFIX 5X250MM EXFIX (EXFIX) ×2 IMPLANT
ROD CARBON 8X350 (EXFIX) ×4 IMPLANT
ROD CONNECT CARBON MRI 5X150MM (Rod) ×2 IMPLANT
ROD TO ROD COUPLING EXFIX (EXFIX) ×6 IMPLANT
SPLINT FAST PLASTER 5X30 (CAST SUPPLIES)
SPLINT PLASTER CAST FAST 5X30 (CAST SUPPLIES) IMPLANT
SPONGE LAP 18X18 RF (DISPOSABLE) ×3 IMPLANT
STOCKINETTE STRL 6IN 960660 (GAUZE/BANDAGES/DRESSINGS) ×3 IMPLANT
SUT ETHILON 3-0 FS-10 30 BLK (SUTURE) ×3
SUT VIC AB 3-0 SH 27 (SUTURE) ×3
SUT VIC AB 3-0 SH 27X BRD (SUTURE) ×1 IMPLANT
SUTURE EHLN 3-0 FS-10 30 BLK (SUTURE) ×1 IMPLANT
TOWEL OR 17X26 4PK STRL BLUE (TOWEL DISPOSABLE) ×3 IMPLANT
carbon rod 150mm ×2 IMPLANT

## 2018-02-08 NOTE — Plan of Care (Signed)

## 2018-02-08 NOTE — Consult Note (Deleted)
ORTHOPAEDIC CONSULTATION  REQUESTING PHYSICIAN: Signa KellPatel, Melessia Kaus, MD  Chief Complaint:   L ankle pain  History of Present Illness: Emily Bishop is a 62 y.o. female who stepped into a hole and twisted her ankle last night.  The patient noted immediate ankle pain and inability to ambulate. Pain is described as sharp at its worst and a dull ache at its best.  Pain is rated a 10 out of 10 in severity.  Pain is improved with rest and immobilization.  Pain is worse with any sort of movement.  X-rays in the emergency department showed a L ankle fracture and dislocation. This was reduced and splinted in the ED. CT scan showed trimalleolar ankle fracture with some impaction of the tibiotalar joint.  Past Medical History:  Diagnosis Date  . Hypercholesteremia   . Hypertension    History reviewed. No pertinent surgical history. Social History   Socioeconomic History  . Marital status: Married    Spouse name: Not on file  . Number of children: Not on file  . Years of education: Not on file  . Highest education level: Not on file  Occupational History  . Not on file  Social Needs  . Financial resource strain: Not on file  . Food insecurity:    Worry: Not on file    Inability: Not on file  . Transportation needs:    Medical: Not on file    Non-medical: Not on file  Tobacco Use  . Smoking status: Current Every Day Smoker  . Smokeless tobacco: Never Used  Substance and Sexual Activity  . Alcohol use: Not on file  . Drug use: Not on file  . Sexual activity: Not on file  Lifestyle  . Physical activity:    Days per week: Not on file    Minutes per session: Not on file  . Stress: Not on file  Relationships  . Social connections:    Talks on phone: Not on file    Gets together: Not on file    Attends religious service: Not on file    Active member of club or organization: Not on file    Attends meetings of clubs or  organizations: Not on file    Relationship status: Not on file  Other Topics Concern  . Not on file  Social History Narrative  . Not on file   No family history on file. No Known Allergies Prior to Admission medications   Not on File   Recent Labs    02/07/18 2352  WBC 7.3  HGB 15.3  HCT 43.0  PLT 175  K 3.7  CL 101  CO2 21*  BUN <5*  CREATININE 0.59  GLUCOSE 104*  CALCIUM 8.3*  INR 0.87   Dg Chest 1 View  Result Date: 02/08/2018 CLINICAL DATA:  Initial preoperative evaluation for left ankle ORIF. EXAM: CHEST  1 VIEW COMPARISON:  Prior CT from 11/27/2015. FINDINGS: Cardiac and mediastinal silhouettes are within normal limits. Aortic atherosclerosis noted. Lungs are well inflated. No focal infiltrates. No pulmonary edema or pleural effusion. No pneumothorax. No acute osseous abnormality. IMPRESSION: 1. No active cardiopulmonary disease. 2. Aortic atherosclerosis. Electronically Signed   By: Rise MuBenjamin  McClintock M.D.   On: 02/08/2018 00:00   Dg Ankle 2 Views Left  Result Date: 02/08/2018 CLINICAL DATA:  Left ankle fracture, preoperative lateral evaluation EXAM: LEFT ANKLE - 2 VIEW COMPARISON:  02/07/2018 left ankle radiographs FINDINGS: Overlying cast obscures fine bone detail. Approximately 1 cm superior displacement of the  posterior malleolar fracture fragment. Comminuted lateral malleolus fracture with 7 mm posterior displacement of the dominant distal fracture fragment. Widening of the anterior ankle mortise. No suspicious focal osseous lesions. No radiopaque foreign body. IMPRESSION: Displaced posterior malleolar and lateral malleolar fracture fragments as detailed on this lateral view. Anterior ankle mortise widening. Electronically Signed   By: Delbert Phenix M.D.   On: 02/08/2018 09:09   Dg Ankle Complete Left  Result Date: 02/07/2018 CLINICAL DATA:  Follow-up examination status post splinting and reduction. EXAM: LEFT ANKLE COMPLETE - 3+ VIEW COMPARISON:  Prior radiograph from  earlier the same day. FINDINGS: Splinting material overlies the left ankle, somewhat limiting evaluation for fine osseous detail. Previously seen fracture/dislocation of the left ankle has been reduced. Distal left fibular fracture in near anatomic alignment. Persistent slight posterior and superior displacement of a posterior malleolar fracture. Tibiotalar joint now in anatomic alignment. Ankle mortise is approximated. No other new osseous abnormality about the ankle. IMPRESSION: 1. Left ankle joint in gross anatomic alignment status post splinting and reduction. 2. Distal left fibular fracture in anatomic alignment. 3. Persistent slight posterior and superior displacement of a posterior malleolar fracture. Electronically Signed   By: Rise Mu M.D.   On: 02/07/2018 23:57   Dg Ankle Complete Left  Result Date: 02/07/2018 CLINICAL DATA:  Stepped in hole EXAM: LEFT ANKLE COMPLETE - 3+ VIEW COMPARISON:  None. FINDINGS: Acute fracture distal shaft and metaphysis of the fibula with 1 bone with of lateral and posterior displacement of distal fracture fragment. Mild to moderate lateral angulation of distal fracture fragment. Posterior dislocation of the talus with respect to the distal tibia and fibula. Suspicion of posterior malleolar fracture. Widening of the anterior joint. Small plantar calcaneal spur. IMPRESSION: 1. Acute displaced and angulated fracture of the distal fibula 2. Posterior dislocation of the talar dome with respect to the distal tibia and fibula. Suspect that there is displaced posterior malleolar fracture. Electronically Signed   By: Jasmine Pang M.D.   On: 02/07/2018 22:49   Ct Ankle Left Wo Contrast  Result Date: 02/08/2018 CLINICAL DATA:  Trimalleolar fracture of the left ankle with dislocation of the talus. EXAM: CT OF THE LEFT ANKLE WITHOUT CONTRAST TECHNIQUE: Multidetector CT imaging of the left ankle was performed according to the standard protocol. Multiplanar CT image  reconstructions were also generated. COMPARISON:  Left ankle radiographs 02/07/2018 FINDINGS: Bones/Joint/Cartilage Comminuted intra-articular fractures are demonstrated of the distal tibial metaphysis with fracture lines involving the medial malleolus, posterior malleolus, and lateral tibia. Fracture lines extend to the articular surface with up to 5 mm cortical depression. Mildly displaced medial malleolar fragment. Superior displacement of the posterior malleolar fragment. Comminuted transverse fracture of the distal fibula. Widening of the lateral tibiotalar joint. Displaced fragment from the anterolateral tibia suggesting anterior tibia fibular ligament injury. Tiny intra-articular fragment demonstrated in the anterolateral tibiotalar joint. The talus, calcaneus, and visualized tarsal bones appear otherwise intact. Ligaments Suboptimally assessed by CT. Muscles and Tendons Small displaced bone fragments suggests anterior tibia fibular ligament disruption. Visualized Achilles tendon, peroneal tendons, and anterior and posterior tendons appear grossly intact. Soft tissues Diffuse soft tissue swelling and edema about the left ankle. IMPRESSION: Comminuted trimalleolar fractures of the left ankle as described. Electronically Signed   By: Burman Nieves M.D.   On: 02/08/2018 03:15     Positive ROS: All other systems have been reviewed and were otherwise negative with the exception of those mentioned in the HPI and as above.  Physical Exam:  BP 127/86 (BP Location: Left Arm)   Pulse 89   Temp 98.2 F (36.8 C) (Oral)   Resp 18   Ht 5' 6.5" (1.689 m)   Wt 69.2 kg (152 lb 8 oz)   SpO2 96%   BMI 24.25 kg/m  General:  Alert, no acute distress Psychiatric:  Patient is competent for consent with normal mood and affect   Cardiovascular:  No pedal edema, regular rate and rhythm Respiratory:  No wheezing, non-labored breathing GI:  Abdomen is soft and non-tender Skin:  No lesions in the area of chief  complaint, no erythema Neurologic:  Sensation intact distally, CN grossly intact Lymphatic:  No axillary or cervical lymphadenopathy  Orthopedic Exam:  LLE: + wiggles toes + SILT over toes <2s cap refill, toes wwp Splint c/d/i  X-rays/CT:  Trimalleolar ankle fracture/dislocation that was reduced in ED. CT scan shows large posterior malleolar fragment with sizeable Chaput and Volkmann's fragments as well as some joint depression/impaction. Additional lateral XR this AM shows subluxation of the tibiotalar joint.   Assessment/Plan: KENI WAFER is a 62 y.o. female with a L trimalleolar ankle fracture/dislocation with joint depression as well as current residual tibiotalar joint subluxation.    1. I discussed the various treatment options including both surgical and non-surgical management of her fracture with the patient and her husband. After discussion of risks, benefits, and alternatives to surgery, the family and patient were in agreement to proceed with surgery. We discussed that this would be a staged surgery. The first stage today would consist of external fixation of her ankle to allow for a reduced tibiotalar joint. We will discuss transferring her care to an orthopedic trauma surgeon for definitive fixation afterwards. Patient in agreement with above plan.   2. NPO until OR       Signa Kell   02/08/2018 9:07 AM

## 2018-02-08 NOTE — Op Note (Signed)
Operative Note   SURGERY DATE: 02/08/2018  PRE-OP DIAGNOSIS:  1. Trimalleolar fracture and dislocation of L ankle   POST-OP DIAGNOSIS:  1. Trimalleolar fracture and dislocation of L ankle  PROCEDURE(S): 1. Application of multiplanar external fixator to L ankle  SURGEON: Rosealee AlbeeSunny H. Shenequa Howse, MD   ANESTHESIA: Gen  ESTIMATED BLOOD LOSS: 5cc  DRAINS:  None  TOTAL IV FLUIDS: see anesthesia record  IMPLANTS: Stryker Hoffman External Fixator  INDICATION(S): The patient is a 62 y.o. female who had a fall and twisting injury to the L ankle. Radiographs showed a trimalleolar ankle fracture and dislocation. Reduction was performed in the ED, but the tibiotalar joint was still subluxed. Additionally, CT scan showed a pilon fracture component with articular surface depression. After discussion of risks, benefits, and alternatives to surgery, the patient elected to proceed with external fixation of ankle to provide stability while soft tissue swelling improved. This will be the first stage of a planned two stage surgery. I discussed with the patient that we would plan to transfer her care to an orthopedic traumatologist for definitive fixation, and patient in agreement. After discussion of risks, benefits, and alternatives to surgery, the patient elected to proceed with external fixation of L ankle today.  OPERATIVE FINDINGS:  Trimalleolar fracture and dislocation of L ankle with pilon component; moderate soft tissue swelling  OPERATIVE REPORT:   The patient was seen in the Holding Room. The risks, benefits, complications, treatment options, and expected outcomes were discussed with the patient. The risks and potential complications of the problem and purposed treatment include but are not limited to infection, bleeding, pain, stiffness, nerve and vessel injury and complication secondary to the anesthetic. The patient concurred with the proposed plan, giving informed consent.  The site of surgery  was properly noted/marked.  The patient was taken to Operating Room and transferred to the operating room table. A Time Out was held and the patient identity, procedure, and laterality was confirmed. After administration of adequate anesthesia, the entire lower extremity was prescrubbed with Hibiclens and alcohol, prepped with Chloroprep, and draped in sterile fashion. The patient was given pre-operative IV antibiotics within 30 minutes of the skin incision.  Two tibial half-pins were inserted bicortically under fluoroscopic guidance. Next, a calcaneal transfixation pin was also placed under fluoroscopic guidance. Appropriate bar to bar and pin to bar clamps were placed with carbon fiber rods medially and laterally. Additional 1st metatarsal pin was placed to keep the foot dorsiflexed as this aided in the reduction. This allowed for multiplanar control and fixation. The fracture was held in a reduced position, confirmed fluoroscopically, and the external fixator was tightened in this position. The wounds were irrigated and dressed with xeroform, fluffs, cotton wrap, and ACE wrap. The patient was awakened from anesthesia without any further complication and transferred to PACU for further recovery.    POST-OPERATIVE PLAN:  The patient will be NWB on the operative extremity. ASA for DVT ppx. PT/OT in AM. Plan for DC home on POD#1. Perioperative antibiotics x 24 hrs.

## 2018-02-08 NOTE — Anesthesia Post-op Follow-up Note (Signed)
Anesthesia QCDR form completed.        

## 2018-02-08 NOTE — Transfer of Care (Signed)
Immediate Anesthesia Transfer of Care Note  Patient: Riki Ruskamela D Folkert  Procedure(s) Performed: external fixation left ankle (Left Ankle)  Patient Location: PACU  Anesthesia Type:General  Level of Consciousness: sedated  Airway & Oxygen Therapy: Patient Spontanous Breathing and Patient connected to face mask oxygen  Post-op Assessment: Report given to RN and Post -op Vital signs reviewed and stable  Post vital signs: Reviewed and stable  Last Vitals:  Vitals Value Taken Time  BP 156/88 02/08/2018 11:08 AM  Temp 36.6 C 02/08/2018 11:08 AM  Pulse 113 02/08/2018 11:10 AM  Resp 16 02/08/2018 11:10 AM  SpO2 97 % 02/08/2018 11:10 AM  Vitals shown include unvalidated device data.  Last Pain:  Vitals:   02/08/18 1108  TempSrc:   PainSc: Asleep         Complications: No apparent anesthesia complications

## 2018-02-08 NOTE — Anesthesia Procedure Notes (Signed)
Procedure Name: Intubation Date/Time: 02/08/2018 9:56 AM Performed by: Milus Height, CRNA Pre-anesthesia Checklist: Patient identified, Patient being monitored, Timeout performed, Emergency Drugs available and Suction available Patient Re-evaluated:Patient Re-evaluated prior to induction Oxygen Delivery Method: Circle system utilized Preoxygenation: Pre-oxygenation with 100% oxygen Induction Type: IV induction Ventilation: Mask ventilation without difficulty Laryngoscope Size: Mac and 3 Grade View: Grade I Tube type: Oral Tube size: 7.0 mm Number of attempts: 1 Airway Equipment and Method: Stylet Placement Confirmation: ETT inserted through vocal cords under direct vision,  positive ETCO2 and breath sounds checked- equal and bilateral Secured at: 20 cm Tube secured with: Tape Dental Injury: Teeth and Oropharynx as per pre-operative assessment

## 2018-02-08 NOTE — Anesthesia Postprocedure Evaluation (Signed)
Anesthesia Post Note  Patient: Emily Bishop D Fohl  Procedure(s) Performed: external fixation left ankle (Left Ankle)  Patient location during evaluation: PACU Anesthesia Type: General Level of consciousness: awake and alert Pain management: pain level controlled Vital Signs Assessment: post-procedure vital signs reviewed and stable Respiratory status: spontaneous breathing, nonlabored ventilation, respiratory function stable and patient connected to nasal cannula oxygen Cardiovascular status: blood pressure returned to baseline and stable Postop Assessment: no apparent nausea or vomiting Anesthetic complications: no     Last Vitals:  Vitals:   02/08/18 1153 02/08/18 1208  BP: 138/85 (!) 143/87  Pulse: (!) 101 97  Resp: 16   Temp: 36.5 C 36.6 C  SpO2: 95% 91%    Last Pain:  Vitals:   02/08/18 1208  TempSrc: Oral  PainSc:                  Cleda MccreedyJoseph K Piscitello

## 2018-02-08 NOTE — Progress Notes (Signed)
Lt foot with ruddy color toes, pt with good movement and sensation in left toes, rapid cap refill.

## 2018-02-08 NOTE — Anesthesia Preprocedure Evaluation (Signed)
Anesthesia Evaluation  Patient identified by MRN, date of birth, ID band Patient awake    Reviewed: Allergy & Precautions, H&P , NPO status , Patient's Chart, lab work & pertinent test results  History of Anesthesia Complications Negative for: history of anesthetic complications  Airway Mallampati: III  TM Distance: >3 FB Neck ROM: full    Dental  (+) Chipped, Poor Dentition   Pulmonary neg shortness of breath, COPD, Current Smoker,           Cardiovascular Exercise Tolerance: Good hypertension, (-) angina     Neuro/Psych CVA, Residual Symptoms negative psych ROS   GI/Hepatic negative GI ROS, Neg liver ROS, neg GERD  ,  Endo/Other  negative endocrine ROS  Renal/GU      Musculoskeletal   Abdominal   Peds  Hematology negative hematology ROS (+)   Anesthesia Other Findings Past Medical History: No date: Hypercholesteremia No date: Hypertension  History reviewed. No pertinent surgical history.  BMI    Body Mass Index:  24.25 kg/m      Reproductive/Obstetrics negative OB ROS                             Anesthesia Physical Anesthesia Plan  ASA: III  Anesthesia Plan: General ETT   Post-op Pain Management:    Induction: Intravenous  PONV Risk Score and Plan: Ondansetron, Dexamethasone, Midazolam and Treatment may vary due to age or medical condition  Airway Management Planned: Oral ETT  Additional Equipment:   Intra-op Plan:   Post-operative Plan: Extubation in OR  Informed Consent: I have reviewed the patients History and Physical, chart, labs and discussed the procedure including the risks, benefits and alternatives for the proposed anesthesia with the patient or authorized representative who has indicated his/her understanding and acceptance.   Dental Advisory Given  Plan Discussed with: Anesthesiologist, CRNA and Surgeon  Anesthesia Plan Comments: (Patient consented  for risks of anesthesia including but not limited to:  - adverse reactions to medications - damage to teeth, lips or other oral mucosa - sore throat or hoarseness - Damage to heart, brain, lungs or loss of life  Patient voiced understanding.)        Anesthesia Quick Evaluation

## 2018-02-08 NOTE — H&P (Addendum)
ORTHOPAEDIC HISTORY AND PHYSICAL  Chief Complaint:   L ankle pain  History of Present Illness: MAGDALYNN DAVILLA is a 62 y.o. female who stepped into a hole and twisted her ankle last night.  The patient noted immediate ankle pain and inability to ambulate. Pain is described as sharp at its worst and a dull ache at its best.  Pain is rated a 10 out of 10 in severity.  Pain is improved with rest and immobilization.  Pain is worse with any sort of movement.  X-rays in the emergency department showed a L ankle fracture and dislocation. This was reduced and splinted in the ED. CT scan showed trimalleolar ankle fracture with some impaction of the tibiotalar joint.      Past Medical History:  Diagnosis Date  . Hypercholesteremia   . Hypertension    History reviewed. No pertinent surgical history. Social History   Socioeconomic History  . Marital status: Married    Spouse name: Not on file  . Number of children: Not on file  . Years of education: Not on file  . Highest education level: Not on file  Occupational History  . Not on file  Social Needs  . Financial resource strain: Not on file  . Food insecurity:    Worry: Not on file    Inability: Not on file  . Transportation needs:    Medical: Not on file    Non-medical: Not on file  Tobacco Use  . Smoking status: Current Every Day Smoker  . Smokeless tobacco: Never Used  Substance and Sexual Activity  . Alcohol use: Not on file  . Drug use: Not on file  . Sexual activity: Not on file  Lifestyle  . Physical activity:    Days per week: Not on file    Minutes per session: Not on file  . Stress: Not on file  Relationships  . Social connections:    Talks on phone: Not on file    Gets together: Not on file    Attends religious service: Not on file    Active member of club or organization: Not on file    Attends meetings of clubs or organizations: Not on file    Relationship status: Not on file   Other Topics Concern  . Not on file  Social History Narrative  . Not on file   No family history on file. No Known Allergies Prior to Admission medications   Not on File   RecentLabs(last2labs)     Recent Labs    02/07/18 2352  WBC 7.3  HGB 15.3  HCT 43.0  PLT 175  K 3.7  CL 101  CO2 21*  BUN <5*  CREATININE 0.59  GLUCOSE 104*  CALCIUM 8.3*  INR 0.87      ImagingResults(Last48hours)  Dg Chest 1 View  Result Date: 02/08/2018 CLINICAL DATA:  Initial preoperative evaluation for left ankle ORIF. EXAM: CHEST  1 VIEW COMPARISON:  Prior CT from 11/27/2015. FINDINGS: Cardiac and mediastinal silhouettes are within normal limits. Aortic atherosclerosis noted. Lungs are well inflated. No focal infiltrates. No pulmonary edema or pleural effusion. No pneumothorax. No acute osseous abnormality. IMPRESSION: 1. No active cardiopulmonary disease. 2. Aortic atherosclerosis. Electronically Signed   By: Rise Mu M.D.   On: 02/08/2018 00:00   Dg Ankle 2 Views Left  Result Date: 02/08/2018 CLINICAL DATA:  Left ankle fracture, preoperative lateral evaluation EXAM: LEFT ANKLE - 2 VIEW COMPARISON:  02/07/2018 left ankle radiographs FINDINGS: Overlying cast obscures  fine bone detail. Approximately 1 cm superior displacement of the posterior malleolar fracture fragment. Comminuted lateral malleolus fracture with 7 mm posterior displacement of the dominant distal fracture fragment. Widening of the anterior ankle mortise. No suspicious focal osseous lesions. No radiopaque foreign body. IMPRESSION: Displaced posterior malleolar and lateral malleolar fracture fragments as detailed on this lateral view. Anterior ankle mortise widening. Electronically Signed   By: Delbert Phenix M.D.   On: 02/08/2018 09:09   Dg Ankle Complete Left  Result Date: 02/07/2018 CLINICAL DATA:  Follow-up examination status post splinting and reduction. EXAM: LEFT ANKLE COMPLETE - 3+ VIEW COMPARISON:  Prior  radiograph from earlier the same day. FINDINGS: Splinting material overlies the left ankle, somewhat limiting evaluation for fine osseous detail. Previously seen fracture/dislocation of the left ankle has been reduced. Distal left fibular fracture in near anatomic alignment. Persistent slight posterior and superior displacement of a posterior malleolar fracture. Tibiotalar joint now in anatomic alignment. Ankle mortise is approximated. No other new osseous abnormality about the ankle. IMPRESSION: 1. Left ankle joint in gross anatomic alignment status post splinting and reduction. 2. Distal left fibular fracture in anatomic alignment. 3. Persistent slight posterior and superior displacement of a posterior malleolar fracture. Electronically Signed   By: Rise Mu M.D.   On: 02/07/2018 23:57   Dg Ankle Complete Left  Result Date: 02/07/2018 CLINICAL DATA:  Stepped in hole EXAM: LEFT ANKLE COMPLETE - 3+ VIEW COMPARISON:  None. FINDINGS: Acute fracture distal shaft and metaphysis of the fibula with 1 bone with of lateral and posterior displacement of distal fracture fragment. Mild to moderate lateral angulation of distal fracture fragment. Posterior dislocation of the talus with respect to the distal tibia and fibula. Suspicion of posterior malleolar fracture. Widening of the anterior joint. Small plantar calcaneal spur. IMPRESSION: 1. Acute displaced and angulated fracture of the distal fibula 2. Posterior dislocation of the talar dome with respect to the distal tibia and fibula. Suspect that there is displaced posterior malleolar fracture. Electronically Signed   By: Jasmine Pang M.D.   On: 02/07/2018 22:49   Ct Ankle Left Wo Contrast  Result Date: 02/08/2018 CLINICAL DATA:  Trimalleolar fracture of the left ankle with dislocation of the talus. EXAM: CT OF THE LEFT ANKLE WITHOUT CONTRAST TECHNIQUE: Multidetector CT imaging of the left ankle was performed according to the standard protocol.  Multiplanar CT image reconstructions were also generated. COMPARISON:  Left ankle radiographs 02/07/2018 FINDINGS: Bones/Joint/Cartilage Comminuted intra-articular fractures are demonstrated of the distal tibial metaphysis with fracture lines involving the medial malleolus, posterior malleolus, and lateral tibia. Fracture lines extend to the articular surface with up to 5 mm cortical depression. Mildly displaced medial malleolar fragment. Superior displacement of the posterior malleolar fragment. Comminuted transverse fracture of the distal fibula. Widening of the lateral tibiotalar joint. Displaced fragment from the anterolateral tibia suggesting anterior tibia fibular ligament injury. Tiny intra-articular fragment demonstrated in the anterolateral tibiotalar joint. The talus, calcaneus, and visualized tarsal bones appear otherwise intact. Ligaments Suboptimally assessed by CT. Muscles and Tendons Small displaced bone fragments suggests anterior tibia fibular ligament disruption. Visualized Achilles tendon, peroneal tendons, and anterior and posterior tendons appear grossly intact. Soft tissues Diffuse soft tissue swelling and edema about the left ankle. IMPRESSION: Comminuted trimalleolar fractures of the left ankle as described. Electronically Signed   By: Burman Nieves M.D.   On: 02/08/2018 03:15      Positive ROS: All other systems have been reviewed and were otherwise negative with the exception of  those mentioned in the HPI and as above.  Physical Exam: BP 127/86 (BP Location: Left Arm)   Pulse 89   Temp 98.2 F (36.8 C) (Oral)   Resp 18   Ht 5' 6.5" (1.689 m)   Wt 69.2 kg (152 lb 8 oz)   SpO2 96%   BMI 24.25 kg/m  General:  Alert, no acute distress Psychiatric:  Patient is competent for consent with normal mood and affect   Cardiovascular:  No pedal edema, regular rate and rhythm Respiratory:  No wheezing, non-labored breathing, chest sounds clear GI:  Abdomen is soft and  non-tender Skin:  No lesions in the area of chief complaint, no erythema Neurologic:  Sensation intact distally, CN grossly intact Lymphatic:  No axillary or cervical lymphadenopathy  Orthopedic Exam:  LLE: + wiggles toes + SILT over toes <2s cap refill, toes wwp Splint c/d/i  X-rays/CT:  Trimalleolar ankle fracture/dislocation that was reduced in ED. CT scan shows large posterior malleolar fragment with sizeable Chaput and Volkmann's fragments as well as some joint depression/impaction. Additional lateral XR this AM shows subluxation of the tibiotalar joint.   Assessment/Plan: Riki Ruskamela D Dechellis is a 62 y.o. female with a L trimalleolar ankle fracture/dislocation with joint depression as well as current residual tibiotalar joint subluxation.   1. I discussed the various treatment options including both surgical and non-surgical management of her fracture with the patient and her husband. After discussion of risks, benefits, and alternatives to surgery, the family and patient were in agreement to proceed with surgery. We discussed that this would be a staged surgery. The first stage today would consist of external fixation of her ankle to allow for a reduced tibiotalar joint. We will discuss transferring her care to an orthopedic trauma surgeon for definitive fixation afterwards. Patient in agreement with above plan.   2. NPO until OR       Signa KellSunny Licet Dunphy   02/08/2018 9:07 AM

## 2018-02-08 NOTE — Progress Notes (Signed)
Arrived to room 140 from er via stretcher. Pt awake, alert and approp. Splint intact to lt ankle/foot. Pt able to move self over to bed. LLE elevated on 2 pillows. scd placed on rt leg. Oriented to room, call bell in reach.

## 2018-02-08 NOTE — Progress Notes (Signed)
To CT via w/c.

## 2018-02-08 NOTE — ED Notes (Signed)
Pt was assisted to the restroom. Pt dry and being taken to her room. The floor was called prior to departure.

## 2018-02-09 LAB — HIV ANTIBODY (ROUTINE TESTING W REFLEX): HIV Screen 4th Generation wRfx: NONREACTIVE

## 2018-02-09 MED ORDER — TRAMADOL HCL 50 MG PO TABS: 50.0000 mg | ORAL_TABLET | Freq: Four times a day (QID) | ORAL | 1 refills | Status: DC | PRN

## 2018-02-09 MED ORDER — OXYCODONE HCL 5 MG PO TABS: 5.0000 mg | ORAL_TABLET | ORAL | 0 refills | Status: DC | PRN

## 2018-02-09 MED ORDER — ASPIRIN 325 MG PO TBEC: 325.0000 mg | DELAYED_RELEASE_TABLET | Freq: Two times a day (BID) | ORAL | 0 refills | Status: DC

## 2018-02-09 MED ORDER — ONDANSETRON HCL 4 MG PO TABS: 4.0000 mg | ORAL_TABLET | Freq: Four times a day (QID) | ORAL | 0 refills | Status: DC | PRN

## 2018-02-09 NOTE — Discharge Instructions (Signed)
Ankle Fracture Surgery  Post-Op Instructions  1. Bracing or crutches: Dan HumphreysWalker will be provided at the time of discharge.  No weight applied to the right ankle.  2. Splint/Cast: You will have a splint (3/4 cast) around your external fixator on your leg after surgery. Ensure that this remains clean and dry until follow up appointment. If this becomes wet, you need to call our offices to get it changed or else you risk skin breakdown.    3. Driving:  Plan on not driving for at least four to six weeks. Please note that you are advised NOT to drive while taking narcotic pain medications as you may be impaired and unsafe to drive.  4. Activity: Move your toes several times an hour while awake to prevent blood clots. Weight bearing: Non-weight bearing.  Use walker for at least 6 weeks, if not longer based on your surgery. Bending and straightening the knee is unlimited. Elevate knee above heart level as much as possible for one week. Avoid standing more than 5 minutes (consecutively) for the first week.  Avoid long distance travel for 4 weeks.  5. Medications:  - You have been provided a prescription for narcotic pain medicine. After surgery, take 1-2 narcotic tablets every 4 hours if needed for severe pain. Please start this as soon as you begin to start having pain (if you received a nerve block, start taking as soon as this wears off).  - A prescription for anti-nausea medication will be provided in case the narcotic medicine causes nausea - take 1 tablet every 6 hours only if nauseated.  - Take enteric coated aspirin 325 mg once daily for 4 weeks to prevent blood clots.  -Take tylenol 1000 mg every 8 hours for pain.  May stop tylenol 5 days after surgery if you are having minimal pain.  If you are taking prescription medication for anxiety, depression, insomnia, muscle spasm, chronic pain, or for attention deficit disorder you are advised that you are at a higher risk of adverse effects with use  of narcotics post-op, including narcotic addiction/dependence, depressed breathing, death. If you use non-prescribed substances: alcohol, marijuana, cocaine, heroin, methamphetamines, etc., you are at a higher risk of adverse effects with use of narcotics post-op, including narcotic addiction/dependence, depressed breathing, death. You are advised that taking > 50 morphine milligram equivalents (MME) of narcotic pain medication per day results in twice the risk of overdose or death. For your prescription provided: oxycodone 5 mg - taking more than 6 tablets per day. Be advised that we will prescribe narcotics short-term, for acute post-operative pain only - 1 week for minor operations such as knee arthroscopy for meniscus tear resection, and 3 weeks for major operations such as knee repair/reconstruction surgeries.   6. Physical Therapy: Plan to start after follow up appointment at 2 weeks. 1-2 times per week for ~12-16 weeks. Therapy typically starts on post operative Day 3 or 4. You have been provided an order for physical therapy. The therapist will provide home exercises. Please contact our offices if this appointment has not been scheduled.   7. Work/School: May return to full work when off of crutches. May do light duty/desk job or return to school in approximately 1-2 weeks when off of narcotics, pain is well-controlled, and swelling has decreased.  8. Post-Op Appointments: Your first post-op appointment will be set up with the ankle surgeon in [place].  If you find that they have not been scheduled please call the Orthopaedic Appointment front desk at  [phone]. ° ° ° ° °

## 2018-02-09 NOTE — Evaluation (Signed)
Occupational Therapy Evaluation Patient Details Name: Emily Bishop D Cannella MRN: [id number] DOB: Aug 21, 1955 Today's Date: 02/09/2018    History of Present Illness Patient is  62 y.o. female who was [facility] for an L ankle external fixation 02/08/18 following a fall sustained from stepping in a hole. PMHx includes:  of hypercholesteremia and HTN   Clinical Impression   Pt. presents with nausea, vomiting,  weakness, limited activity tolerance, LLE NWB and limited functional mobility which limits her ability to complete basic ADL and IADL functioning. Pt. Resides at home with her husband. Pt. was independent with ADLs, and IADL functioning: including meal preparation, and medication management. Pt. was able to drive and was working. Pt. Education was provided about A/E use for LE ADLs. Pt. Presented with nausea, and vomiting during the treatment session. Pt. Was assisted, and nursing was informed. Pt. Could benefit from OT services for ADL training, A/E training, and pt. Education about home modification, and DME. Pt. Plans to return home upon discharge with husband to assist pt. as needed. Pt. Could benefit from follow-up HHOT services upon discharge.    Follow Up Recommendations  Home health OT    Equipment Recommendations  3 in 1 bedside commode    Recommendations for Other Services       Precautions / Restrictions Precautions Precautions: Fall Required Braces or Orthoses: Other Brace/Splint Other Brace/Splint: external fixator Restrictions Weight Bearing Restrictions: Yes LLE Weight Bearing: Non weight bearing      Mobility Bed Mobility        Transfers Overall transfer level: Needs assistance Equipment used: Rolling walker (2 wheeled) Transfers: Sit to/from Stand Sit to Stand: Min guard;Supervision         General transfer comment: Mobility per PT    Balance Overall balance assessment: Mild deficits observed, not formally tested;Needs assistance   Sitting  balance-Leahy Scale: Good     Standing balance support: Bilateral upper extremity supported Standing balance-Leahy Scale: Poor                             ADL either performed or assessed with clinical judgement   ADL Overall ADL's : Needs assistance/impaired Eating/Feeding: Independent;Set up   Grooming: Set up;Independent   Upper Body Bathing: Set up;Independent   Lower Body Bathing: Maximal assistance;Set up   Upper Body Dressing : Set up;Independent   Lower Body Dressing: Maximal assistance;Set up                 General ADL Comments: Pt. education was provided about A/E use for LE ADLs.      Vision Baseline Vision/History: Wears glasses Wears Glasses: At all times Patient Visual Report: No change from baseline       Perception     Praxis      Pertinent Vitals/Pain Pain Assessment: 0-10 Pain Score: 4  Pain Location: pain with movement, no pain at rest Pain Descriptors / Indicators: Aching;Tingling Pain Intervention(s): Monitored during session;Repositioned     Hand Dominance Right   Extremity/Trunk Assessment Upper Extremity Assessment Upper Extremity Assessment: Overall WFL for tasks assessed          Communication Communication Communication: No difficulties   Cognition Arousal/Alertness: Awake/alert Behavior During Therapy: WFL for tasks assessed/performed Overall Cognitive Status: Within Functional Limits for tasks assessed  General Comments       Exercises    Shoulder Instructions      Home Living Family/patient expects to be discharged to:: Private residence Living Arrangements: Spouse/significant other Available Help at Discharge: Family Type of Home: House Home Access: Ramped entrance;Stairs to enter Entrance Stairs-Number of Steps: 3 STE but husband has built a ramp Entrance Stairs-Rails: None Home Layout: One level     Bathroom Shower/Tub: Tub/shower  unit(Garden tub)   Bathroom Toilet: Standard Bathroom Accessibility: Yes   Home Equipment: Environmental consultant - standard          Prior Functioning/Environment Level of Independence: Independent                 OT Problem List: Decreased strength;Decreased activity tolerance;Decreased knowledge of use of DME or AE;Decreased range of motion;Pain      OT Treatment/Interventions: Self-care/ADL training;Therapeutic exercise;Patient/family education;Therapeutic activities;DME and/or AE instruction    OT Goals(Current goals can be found in the care plan section) Acute Rehab OT Goals Patient Stated Goal: Pt. wouldelike to return home with HHOT OT Goal Formulation: With patient Potential to Achieve Goals: Good  OT Frequency: Min 2X/week   Barriers to D/C:            Co-evaluation              AM-PAC PT "6 Clicks" Daily Activity     Outcome Measure Help from another person eating meals?: None Help from another person taking care of personal grooming?: None Help from another person toileting, which includes using toliet, bedpan, or urinal?: A Little Help from another person bathing (including washing, rinsing, drying)?: A Lot Help from another person to put on and taking off regular upper body clothing?: None Help from another person to put on and taking off regular lower body clothing?: A Lot 6 Click Score: 19   End of Session Equipment Utilized During Treatment: Gait belt  Activity Tolerance: Other (comment)(Limited by nausea, and vomiting.) Patient left: in chair;with call bell/phone within reach;with chair alarm set  OT Visit Diagnosis: History of falling (Z91.81)                Time: 1610-9604 OT Time Calculation (min): 27 min Charges:  OT General Charges $OT Visit: 1 Visit OT Evaluation $OT Eval Moderate Complexity: 1 Mod G-Codes:    Olegario Messier, MS, OTR/L  Olegario Messier 02/09/2018, 9:42 AM

## 2018-02-09 NOTE — Care Management Note (Addendum)
Case Management Note  Patient Details  Name: Riki Ruskamela D Deveny MRN: [id number] Date of Birth: 1956-01-11  Subjective/Objective:                   No home health PT order; patient anticipated home health PT.  PT evaluation pending.  RNCM spoke with patient regarding discharge to home today. She plans to return home with her husband today.  She does not have any DME at home.  She keeps says she wants PT out of [place]. RNCM will provide list based on PT evaluation.  Action/Plan:  Update: patient has front-wheeled walker available for use at home. RNCM has cancelled walk[facility] with [facility]. List of home health agencies provided to patient and she would like to use [facility] at home. Husband will provide transportation to home.    Expected Discharge Date:  02/09/18               Expected Discharge Plan:     In-House Referral:     Discharge planning Services  CM Consult  Post Acute Care Choice:  Durable Medical Equipment Choice offered to:  Patient  DME Arranged:  Dan HumphreysWalker rolling DME Agency:  [facility]  [facility]     Status of Service:  In process, will continue to follow  If discussed at Long Length of Stay Meetings, dates discussed:    Additional Comments:  Collie Siadngela Yeraldi Fidler, RN 02/09/2018, 8:08 AM

## 2018-02-09 NOTE — Progress Notes (Signed)
Pt in no acute distress. Is vomiting this AM but is anxious to discharge. RN removed IV. VSS. RN explained discharge instructions to pt and husband and they both verbalized understanding. Wheeled to visitors entrance and assisted into vehicle.

## 2018-02-09 NOTE — Evaluation (Signed)
Physical Therapy Evaluation Patient Details Name: Emily Bishop MRN: [id number] DOB: 31-Jan-1956 Today's Date: 02/09/2018   History of Present Illness  Patient is  62 y.o. female  S/P L ankle external fixation 02/08/18 after a fall PMH of hypercholesteremia and HTN  Clinical Impression  Patient A&O x 4 at start of session, no complaints of L ankle pain at rest, 4/10 with SLR. BP 194/104 in bed and complaints of dizziness. Patient reports being independent prior to admission. Patient demonstrated bed mobility with supervision, transfers with CGA/supervision, and ambulated with RW and CGA for 55ft in room with excellent adherence to NWB precautions of LLE. Did exhibit fatigue and needed several standing rest breaks while ambulating. The patient would benefit from further skilled PT to address decreased mobility, LE strength, balance, gait, and return patient to PLOF.  BP monitored throughout session, when sitting EOB 184/100, BP at end of session 168/95 in chair, no complaints of light headedness. Patient states she has HTN and has not had BP medication in 2 days. Nursing notified.     Follow Up Recommendations Home health PT    Equipment Recommendations  Rolling walker with 5" wheels    Recommendations for Other Services       Precautions / Restrictions Precautions Precautions: Fall Required Braces or Orthoses: Other Brace/Splint Other Brace/Splint: external fixator Restrictions Weight Bearing Restrictions: Yes LLE Weight Bearing: Non weight bearing      Mobility  Bed Mobility Overal bed mobility: Needs Assistance Bed Mobility: Supine to Sit     Supine to sit: Supervision        Transfers Overall transfer level: Needs assistance Equipment used: Rolling walker (2 wheeled) Transfers: Sit to/from Stand Sit to Stand: Min guard;Supervision            Ambulation/Gait Ambulation/Gait assistance: Scientist, clinical (histocompatibility and immunogenetics) (Feet): 15 Feet Assistive device:  Rolling walker (2 wheeled)       General Gait Details: Patient able to adhere to NWB precautions for LLE  Stairs            Wheelchair Mobility    Modified Rankin (Stroke Patients Only)       Balance Overall balance assessment: Mild deficits observed, not formally tested, needs assistance   sitting balance-Leahy scale: Good Standing balance support: Poor                                         Pertinent Vitals/Pain Pain Assessment: 0-10 Pain Score: 4  Pain Location: pain with movement, no pain at rest Pain Descriptors / Indicators: Sore;Tingling Pain Intervention(s): Monitored during session;Repositioned    Home Living Family/patient expects to be discharged to:: Private residence Living Arrangements: Spouse/significant other Available Help at Discharge: Family Type of Home: House Home Access: Stairs to enter;Ramped entrance Entrance Stairs-Rails: None Entrance Stairs-Number of Steps: 3 STE but husband has built a ramp Home Layout: One level Home Equipment: Other (comment);Walker - standard(reports that someone has gone to get a walker, unable to describe what it looks like, does not think it has wheels)      Prior Function Level of Independence: Independent               Hand Dominance   Dominant Hand: Right    Extremity/Trunk Assessment   Upper Extremity Assessment Upper Extremity Assessment: Defer to OT evaluation;Overall [facility] for tasks assessed    Lower Extremity Assessment Lower Extremity Assessment:  RLE deficits/detail;LLE deficits/detail RLE Deficits / Details: 4/5 LLE Deficits / Details: at least 3/5 for hip flexion knee extension, unable to assess L ankle due to external fixator LLE: Unable to fully assess due to immobilization LLE Sensation: WNL       Communication   Communication: No difficulties  Cognition Arousal/Alertness: Awake/alert Behavior During Therapy: WFL for tasks assessed/performed                                           General Comments      Exercises Other Exercises Other Exercises: Patient mobilized to EOB able to maintain position 3 minutes for BP measurement (184/100), able to ambulated to bedside commode and perform toileting activities with CGA/supervision.    Assessment/Plan    PT Assessment Patient needs continued PT services  PT Problem List Decreased strength;Pain;Decreased range of motion;Decreased activity tolerance;Decreased balance;Decreased mobility       PT Treatment Interventions DME instruction;Therapeutic exercise;Gait training;Balance training;Neuromuscular re-education;Functional mobility training;Therapeutic activities;Patient/family education    PT Goals (Current goals can be found in the Care Plan section)  Acute Rehab PT Goals Patient Stated Goal: Patient would like to go home, wants to work with [facility] PT Goal Formulation: With patient Time For Goal Achievement: 02/23/18 Potential to Achieve Goals: Good    Frequency BID   Barriers to discharge        Co-evaluation               AM-PAC PT "6 Clicks" Daily Activity  Outcome Measure Difficulty turning over in bed (including adjusting bedclothes, sheets and blankets)?: A Little Difficulty moving from lying on back to sitting on the side of the bed? : A Little Difficulty sitting down on and standing up from a chair with arms (e.g., wheelchair, bedside commode, etc,.)?: A Little Help needed moving to and from a bed to chair (including a wheelchair)?: A Little Help needed walking in hospital room?: A Little Help needed climbing 3-5 steps with a railing? : A Lot 6 Click Score: 17    End of Session Equipment Utilized During Treatment: Gait belt Activity Tolerance: Patient tolerated treatment well Patient left: in chair;with chair alarm set;with call bell/phone within reach;with SCD's reapplied;Other (comment)(with OT at bedside) Nurse Communication: Mobility status;Other  (comment)(Patients BP) PT Visit Diagnosis: Unsteadiness on feet (R26.81);Other abnormalities of gait and mobility (R26.89);History of falling (Z91.81);Pain Pain - Right/Left: Left Pain - part of body: Ankle and joints of foot    Time: 0832-0904 PT Time Calculation (min) (ACUTE ONLY): 32 min   Charges:   PT Evaluation $PT Eval Low Complexity: 1 Low PT Treatments $Therapeutic Activity: 8-22 mins   PT G Codes:       Olga Coasteriana Kristy Schomburg PT, DPT 9:35 AM,02/09/18 [phone]

## 2018-02-09 NOTE — Progress Notes (Signed)
  Subjective: 1 Day Post-Op Procedure(s) (LRB): external fixation left ankle (Left) Patient reports pain as mild.   Patient seen in rounds with Dr. Allena KatzPatel. Patient is well, and has had no acute complaints or problems Plan is to go Home after hospital stay. Negative for chest pain and shortness of breath Fever: no Gastrointestinal: Negative for nausea and vomiting  Objective: Vital signs in last 24 hours: Temp:  [97.6 F (36.4 C)-98.9 F (37.2 C)] 98.9 F (37.2 C) (07/08 0411) Pulse Rate:  [74-117] 74 (07/08 0456) Resp:  [14-20] 18 (07/08 0411) BP: (127-200)/(74-103) 178/94 (07/08 0456) SpO2:  [91 %-98 %] 91 % (07/08 0411)  Intake/Output from previous day:  Intake/Output Summary (Last 24 hours) at 02/09/2018 0640 Last data filed at 02/09/2018 0557 Gross per 24 hour  Intake 2615 ml  Output 1810 ml  Net 805 ml    Intake/Output this shift: Total I/O In: 220 [P.O.:120; IV Piggyback:100] Out: 1810 [Urine:1810]  Labs: Recent Labs    02/07/18 2352  HGB 15.3   Recent Labs    02/07/18 2352  WBC 7.3  RBC 4.34  HCT 43.0  PLT 175   Recent Labs    02/07/18 2352  NA 132*  K 3.7  CL 101  CO2 21*  BUN <5*  CREATININE 0.59  GLUCOSE 104*  CALCIUM 8.3*   Recent Labs    02/07/18 2352  INR 0.87     EXAM General - Patient is Alert and Oriented Extremity - Neurovascular intact Sensation intact distally Dorsiflexion/Plantar flexion intact Dressing/Incision - clean, dry, no drainage, with external fixator intact with Ace wrap in place. Motor Function - intact, moving toes well on exam.   Past Medical History:  Diagnosis Date  . Hypercholesteremia   . Hypertension     Assessment/Plan: 1 Day Post-Op Procedure(s) (LRB): external fixation left ankle (Left) Active Problems:   Ankle fracture  Estimated body mass index is 24.25 kg/m as calculated from the following:   Height as of this encounter: 5' 6.5" (1.689 m).   Weight as of this encounter: 69.2 kg (152 lb 8  oz). Advance diet Up with therapy D/C IV fluids  Discharge home after physical therapy. [place] [facility] [place] [facility] [place] [facility].  DVT Prophylaxis - Aspirin None weight-Bearing  to right leg  Dedra Skeensodd Bienvenido Proehl, PA-C Orthopaedic Surgery 02/09/2018, 6:40 AM

## 2018-02-09 NOTE — Discharge Summary (Signed)
Physician Discharge Summary  Subjective: 1 Day Post-Op Procedure(s) (LRB): external fixation left ankle (Left) Patient reports pain as mild.   Patient seen in rounds with Dr. Allena Katz. Patient is well, and has had no acute complaints or problems Patient is ready to go home after physical therapy  Physician Discharge Summary  Patient ID: Emily Bishop MRN: [id number] DOB/AGE: 11/29/1955 62 y.o.  Admit date: 02/07/2018 Discharge date: 02/09/2018  Admission Diagnoses:  Discharge Diagnoses:  Active Problems:   Ankle fracture   Discharged Condition: fair  Hospital Course: The patient is postop day 1 from a right ankle trimalleolar external fixator application after reduction.  The patient has done well with her pain control and is ready to go home after physical therapy today.  Treatments: surgery:  1. Application of multiplanar external fixator to L ankle  SURGEON: Rosealee Albee, MD   ANESTHESIA:Gen  ESTIMATED BLOOD LOSS:5cc  DRAINS: None  TOTAL IV FLUIDS:see anesthesia record  IMPLANTS: [employer] Hoffman External Fixator    Discharge Exam: Blood pressure (!) 178/94, pulse 74, temperature 98.9 F (37.2 C), temperature source Oral, resp. rate 18, height 5' 6.5" (1.689 m), weight 69.2 kg (152 lb 8 oz), SpO2 91 %.   Disposition: Discharge disposition: 01-Home or Self Care        Allergies as of 02/09/2018   No Known Allergies     Medication List    TAKE these medications   albuterol 108 (90 Base) MCG/ACT inhaler Commonly known as:  PROVENTIL HFA;VENTOLIN HFA Inhale 2 puffs into the lungs every 4 (four) hours as needed for wheezing.   aspirin 325 MG EC tablet Take 1 tablet (325 mg total) by mouth 2 (two) times daily.   atorvastatin 10 MG tablet Commonly known as:  LIPITOR Take 10 mg by mouth daily.   escitalopram 10 MG tablet Commonly known as:  LEXAPRO Take 10 mg by mouth daily.   fluticasone 50 MCG/ACT nasal spray Commonly known as:   FLONASE Place 2 sprays into both nostrils daily.   losartan 50 MG tablet Commonly known as:  COZAAR Take 50 mg by mouth daily.   ondansetron 4 MG tablet Commonly known as:  ZOFRAN Take 1 tablet (4 mg total) by mouth every 6 (six) hours as needed for nausea.   oxyCODONE 5 MG immediate release tablet Commonly known as:  Oxy IR/ROXICODONE Take 1 tablet (5 mg total) by mouth every 4 (four) hours as needed for moderate pain or severe pain (pain score 4-6).   SPIRIVA HANDIHALER 18 MCG inhalation capsule Generic drug:  tiotropium Place 1 capsule into inhaler and inhale daily.   traMADol 50 MG tablet Commonly known as:  ULTRAM Take 1 tablet (50 mg total) by mouth every 6 (six) hours as needed for moderate pain.   traZODone 50 MG tablet Commonly known as:  DESYREL Take 50-100 mg by mouth at bedtime.        SignedLenard Forth, Obelia Bonello 02/09/2018, 6:49 AM   Objective: Vital signs in last 24 hours: Temp:  [97.6 F (36.4 C)-98.9 F (37.2 C)] 98.9 F (37.2 C) (07/08 0411) Pulse Rate:  [74-117] 74 (07/08 0456) Resp:  [14-20] 18 (07/08 0411) BP: (127-200)/(74-103) 178/94 (07/08 0456) SpO2:  [91 %-98 %] 91 % (07/08 0411)  Intake/Output from previous day:  Intake/Output Summary (Last 24 hours) at 02/09/2018 0649 Last data filed at 02/09/2018 0557 Gross per 24 hour  Intake 2615 ml  Output 1810 ml  Net 805 ml    Intake/Output this shift: Total  I/O In: 220 [P.O.:120; IV Piggyback:100] Out: 1810 [Urine:1810]  Labs: Recent Labs    02/07/18 2352  HGB 15.3   Recent Labs    02/07/18 2352  WBC 7.3  RBC 4.34  HCT 43.0  PLT 175   Recent Labs    02/07/18 2352  NA 132*  K 3.7  CL 101  CO2 21*  BUN <5*  CREATININE 0.59  GLUCOSE 104*  CALCIUM 8.3*   Recent Labs    02/07/18 2352  INR 0.87    EXAM: General - Patient is Alert and Oriented Extremity - Neurovascular intact Sensation intact distally  External fixator in place with the Ace wrap intact and no  drainage. Incision - clean, dry, no drainage Motor Function -plantarflexion and dorsiflexion of her toes intact.  Able to do a straight leg raise.  Assessment/Plan: 1 Day Post-Op Procedure(s) (LRB): external fixation left ankle (Left) Procedure(s) (LRB): external fixation left ankle (Left) Past Medical History:  Diagnosis Date  . Hypercholesteremia   . Hypertension    Active Problems:   Ankle fracture  Estimated body mass index is 24.25 kg/m as calculated from the following:   Height as of this encounter: 5' 6.5" (1.689 m).   Weight as of this encounter: 69.2 kg (152 lb 8 oz). Advance diet Up with therapy D/C IV fluids  Discharge home after physical therapy. Diet - Regular diet Follow up - in several days [place] [facility] [place] [facility]  Activity - NWB Disposition - Home Condition Upon Discharge - Stable DVT Prophylaxis - Aspirin  Emily Skeensodd Jessikah Dicker, PA-C Orthopaedic Surgery 02/09/2018, 6:49 AM

## 2018-02-10 ENCOUNTER — Encounter: Payer: Self-pay | Admitting: Orthopedic Surgery

## 2018-03-05 ENCOUNTER — Inpatient Hospital Stay (HOSPITAL_COMMUNITY)
Admission: RE | Admit: 2018-03-05 | Payer: BLUE CROSS/BLUE SHIELD | Source: Ambulatory Visit | Admitting: Orthopedic Surgery

## 2018-03-05 ENCOUNTER — Encounter (HOSPITAL_COMMUNITY): Admission: RE | Payer: Self-pay | Source: Ambulatory Visit

## 2018-03-05 SURGERY — OPEN REDUCTION INTERNAL FIXATION (ORIF) ANKLE FRACTURE
Anesthesia: General | Site: Ankle | Laterality: Left

## 2018-03-09 ENCOUNTER — Encounter: Payer: Self-pay | Admitting: Orthopedic Surgery

## 2018-03-19 ENCOUNTER — Encounter (HOSPITAL_COMMUNITY): Payer: Self-pay | Admitting: *Deleted

## 2018-03-19 ENCOUNTER — Other Ambulatory Visit: Payer: Self-pay

## 2018-03-19 NOTE — Anesthesia Preprocedure Evaluation (Addendum)
Anesthesia Evaluation  Patient identified by MRN, date of birth, ID band Patient awake    Reviewed: Allergy & Precautions, H&P , NPO status , Patient's Chart, lab work & pertinent test results  History of Anesthesia Complications Negative for: history of anesthetic complications  Airway Mallampati: II  TM Distance: >3 FB Neck ROM: full    Dental  (+) Chipped, Poor Dentition   Pulmonary neg shortness of breath, COPD, Current Smoker,    breath sounds clear to auscultation       Cardiovascular Exercise Tolerance: Good hypertension, (-) angina Rhythm:Regular Rate:Normal     Neuro/Psych CVA, Residual Symptoms negative psych ROS   GI/Hepatic negative GI ROS, Neg liver ROS, neg GERD  ,  Endo/Other  negative endocrine ROS  Renal/GU      Musculoskeletal   Abdominal   Peds  Hematology negative hematology ROS (+)   Anesthesia Other Findings   Reproductive/Obstetrics negative OB ROS                            Anesthesia Physical  Anesthesia Plan  ASA: III  Anesthesia Plan: General   Post-op Pain Management:    Induction: Intravenous  PONV Risk Score and Plan: 3 and Ondansetron, Dexamethasone, Midazolam and Treatment may vary due to age or medical condition  Airway Management Planned: Oral ETT and LMA  Additional Equipment:   Intra-op Plan:   Post-operative Plan: Extubation in OR  Informed Consent: I have reviewed the patients History and Physical, chart, labs and discussed the procedure including the risks, benefits and alternatives for the proposed anesthesia with the patient or authorized representative who has indicated his/her understanding and acceptance.   Dental Advisory Given  Plan Discussed with: CRNA  Anesthesia Plan Comments:        Anesthesia Quick Evaluation

## 2018-03-19 NOTE — H&P (Signed)
Orthopaedic Trauma Service (OTS) Consult   Patient ID: Emily Bishop MRN: [id number] DOB/AGE: 08-05-1956 62 y.o.   HPI: Emily Bishop is an 62 y.o. white female who sustained a left trimalleolar ankle fracture February 08, 2018.  She was seen and evaluated at [facility] where she was placed into an external fixator.  She was heard to the orthopedic trauma specialist due to the complexity of the injury.  Patient was followed very closely in our office but due to swelling we are unable to proceed with definitive internal fixation.  She was noted to have a very good alignment over its.  But we reached 4 weeks postoperatively we felt that surgical intervention was too risky for the potential benefit.  As such we proceeded with definitive fixation with the external fixator.  Patient presents today for removal of her external fixator.  Patient is doing very well overall no acute issues are noted.  Patient has been on aspirin for DVT prevention.  she is just shy of 6 weeks post injury  Past Medical History:  Diagnosis Date  . History of blood transfusion   . Hypercholesteremia   . Hypertension     Past Surgical History:  Procedure Laterality Date  . EYE SURGERY Left    retina occulsion  . ORIF ANKLE FRACTURE Left 02/08/2018   Procedure: external fixation left ankle;  Surgeon: Signa KellPatel, Sunny, MD;  Location: [facility] ORS;  Service: Orthopedics;  Laterality: Left;    No family history on file.  Social History:  reports that she has been smoking. She has a 10.00 pack-year smoking history. She has never used smokeless tobacco. She reports that she does not drink alcohol or use drugs.  Allergies:  Allergies  Allergen Reactions  . Penicillins Swelling and Other (See Comments)    Has patient had a PCN reaction causing immediate rash, facial/tongue/throat swelling, SOB or lightheadedness with hypotension: Unknown Has patient had a PCN reaction causing severe rash involving mucus  membranes or skin necrosis: Unknown Has patient had a PCN reaction that required hospitalization: No Has patient had a PCN reaction occurring within the last 10 years: No If all of the above answers are "NO", then may proceed with Cephalosporin use.     Medications:  Current Meds  Medication Sig  . albuterol (PROVENTIL HFA;VENTOLIN HFA) 108 (90 Base) MCG/ACT inhaler Inhale 2 puffs into the lungs See admin instructions. Inhale 2 puff in to the lungs in the morning. Inhale 2 puffs in to the lungs every 4 hours as needed for shortness of breath or wheezing.  Marland Kitchen. aspirin EC 325 MG EC tablet Take 1 tablet (325 mg total) by mouth 2 (two) times daily. (Patient taking differently: Take 325 mg by mouth daily. )  . atorvastatin (LIPITOR) 10 MG tablet Take 10 mg by mouth daily.  . Cholecalciferol (VITAMIN D3) 5000 units CAPS Take 5,000 Units by mouth daily.  . fluticasone (FLONASE) 50 MCG/ACT nasal spray Place 2 sprays into both nostrils daily.  Marland Kitchen. losartan (COZAAR) 50 MG tablet Take 50 mg by mouth daily.  Marland Kitchen. oxyCODONE-acetaminophen (PERCOCET/ROXICET) 5-325 MG tablet Take 1 tablet by mouth every 8 (eight) hours as needed for severe pain.  Marland Kitchen. SPIRIVA HANDIHALER 18 MCG inhalation capsule Place 18 mcg into inhaler and inhale daily.      No results found for this or any previous visit (from the past 48 hour(s)).  No results found.  Review of Systems  Constitutional: Negative for chills and fever.  Respiratory: Negative  for shortness of breath and wheezing.   Cardiovascular: Negative for chest pain and palpitations.  Gastrointestinal: Negative for abdominal pain, nausea and vomiting.  Neurological: Negative for tingling and sensory change.    Vital signs on arrival to short stay Physical Exam  Constitutional: She is oriented to person, place, and time. She appears well-developed and well-nourished. No distress.  HENT:  Head: Normocephalic and atraumatic.  Eyes: Pupils are equal, round, and reactive  to light. EOM are normal.  Cardiovascular: Normal rate and regular rhythm.  Pulmonary/Chest: Effort normal.  Musculoskeletal:  Left lower extremity External fixator is intact to the left ankle Hypertrophic tissue noted around her pin sites but overall her pin sites look excellent. Swelling is much improved to the ankle DPN, SPN, TN sensory functions are grossly intact EHL, FHL, lesser toe motor functions are intact + DP pulse Extremity is warm No deep calf tenderness  Neurological: She is alert and oriented to person, place, and time.  Nursing note and vitals reviewed.    Assessment/Plan:  62 year old female approximately 6 weeks post left trimalleolar ankle fracture with retained external fixator  -Left trimalleolar ankle fracture status post external fixation 6 weeks ago  OR for removal of ex-fix and debridement of pin sites  Will be nonweightbearing postoperatively  She will be splinted for another 2 weeks and then will begin some graduated weightbearing in a cam boot  Outpatient procedure   - Dispo:  OR for removal of external fixator     Mearl LatinKeith W. Edrik Rundle, PA-C [facility] [phone](P) [phone] (C) [phone] (O) 03/19/2018, 5:29 PM

## 2018-03-20 ENCOUNTER — Ambulatory Visit (HOSPITAL_COMMUNITY): Payer: BLUE CROSS/BLUE SHIELD

## 2018-03-20 ENCOUNTER — Encounter (HOSPITAL_COMMUNITY)
Admission: RE | Disposition: A | Discharge status: Home / Self Care | Payer: Self-pay | Source: Ambulatory Visit | Attending: Orthopedic Surgery

## 2018-03-20 ENCOUNTER — Ambulatory Visit (HOSPITAL_COMMUNITY)
Admission: RE | Admit: 2018-03-20 | Discharge: 2018-03-20 | Disposition: A | Discharge status: Home / Self Care | Payer: BLUE CROSS/BLUE SHIELD | Source: Ambulatory Visit | Attending: Orthopedic Surgery | Admitting: Orthopedic Surgery

## 2018-03-20 ENCOUNTER — Ambulatory Visit (HOSPITAL_COMMUNITY): Payer: BLUE CROSS/BLUE SHIELD | Admitting: Anesthesiology

## 2018-03-20 ENCOUNTER — Encounter (HOSPITAL_COMMUNITY): Payer: Self-pay | Admitting: Surgery

## 2018-03-20 DIAGNOSIS — I10 Essential (primary) hypertension: Secondary | ICD-10-CM | POA: Insufficient documentation

## 2018-03-20 DIAGNOSIS — S82852D Displaced trimalleolar fracture of left lower leg, subsequent encounter for closed fracture with routine healing: Secondary | ICD-10-CM | POA: Diagnosis not present

## 2018-03-20 DIAGNOSIS — L97826 Non-pressure chronic ulcer of other part of left lower leg with bone involvement without evidence of necrosis: Secondary | ICD-10-CM | POA: Diagnosis not present

## 2018-03-20 DIAGNOSIS — Z7951 Long term (current) use of inhaled steroids: Secondary | ICD-10-CM | POA: Diagnosis not present

## 2018-03-20 DIAGNOSIS — L97426 Non-pressure chronic ulcer of left heel and midfoot with bone involvement without evidence of necrosis: Secondary | ICD-10-CM | POA: Insufficient documentation

## 2018-03-20 DIAGNOSIS — Z79899 Other long term (current) drug therapy: Secondary | ICD-10-CM | POA: Insufficient documentation

## 2018-03-20 DIAGNOSIS — Z7982 Long term (current) use of aspirin: Secondary | ICD-10-CM | POA: Insufficient documentation

## 2018-03-20 DIAGNOSIS — E7800 Pure hypercholesterolemia, unspecified: Secondary | ICD-10-CM | POA: Insufficient documentation

## 2018-03-20 DIAGNOSIS — F17200 Nicotine dependence, unspecified, uncomplicated: Secondary | ICD-10-CM | POA: Insufficient documentation

## 2018-03-20 DIAGNOSIS — Z880 Allergy status to penicillin: Secondary | ICD-10-CM | POA: Diagnosis not present

## 2018-03-20 DIAGNOSIS — X58XXXD Exposure to other specified factors, subsequent encounter: Secondary | ICD-10-CM | POA: Insufficient documentation

## 2018-03-20 DIAGNOSIS — T148XXA Other injury of unspecified body region, initial encounter: Secondary | ICD-10-CM

## 2018-03-20 DIAGNOSIS — Z419 Encounter for procedure for purposes other than remedying health state, unspecified: Secondary | ICD-10-CM

## 2018-03-20 HISTORY — PX: EXTERNAL FIXATION REMOVAL: SHX5040

## 2018-03-20 HISTORY — DX: Personal history of other medical treatment: Z92.89

## 2018-03-20 LAB — CBC
HEMATOCRIT: 42.2 % (ref 36.0–46.0)
Hemoglobin: 14.5 g/dL (ref 12.0–15.0)
MCH: 33 pg (ref 26.0–34.0)
MCHC: 34.4 g/dL (ref 30.0–36.0)
MCV: 96.1 fL (ref 78.0–100.0)
PLATELETS: 210 10*3/uL (ref 150–400)
RBC: 4.39 MIL/uL (ref 3.87–5.11)
RDW: 12.1 % (ref 11.5–15.5)
WBC: 6.3 10*3/uL (ref 4.0–10.5)

## 2018-03-20 LAB — BASIC METABOLIC PANEL
Anion gap: 9 (ref 5–15)
CALCIUM: 9.3 mg/dL (ref 8.9–10.3)
CO2: 24 mmol/L (ref 22–32)
CREATININE: 0.63 mg/dL (ref 0.44–1.00)
Chloride: 99 mmol/L (ref 98–111)
GFR calc Af Amer: >60 mL/min (ref >60)
GLUCOSE: 105 mg/dL — AB (ref 70–99)
POTASSIUM: 3.6 mmol/L (ref 3.5–5.1)
Sodium: 132 mmol/L — ABNORMAL LOW (ref 135–145)

## 2018-03-20 SURGERY — REMOVAL, EXTERNAL FIXATION DEVICE, LOWER EXTREMITY
Anesthesia: General | Laterality: Left

## 2018-03-20 MED ORDER — LABETALOL HCL 5 MG/ML IV SOLN
5.0000 mg | Freq: Once | INTRAVENOUS | Status: AC
  Administered 2018-03-20: 5 mg via INTRAVENOUS

## 2018-03-20 MED ORDER — ONDANSETRON HCL 4 MG/2ML IJ SOLN
INTRAMUSCULAR | Status: AC
  Filled 2018-03-20: qty 2

## 2018-03-20 MED ORDER — ROCURONIUM BROMIDE 50 MG/5ML IV SOSY
PREFILLED_SYRINGE | INTRAVENOUS | Status: AC
  Filled 2018-03-20: qty 5

## 2018-03-20 MED ORDER — PROPOFOL 10 MG/ML IV BOLUS
INTRAVENOUS | Status: AC
  Filled 2018-03-20: qty 20

## 2018-03-20 MED ORDER — ROCURONIUM BROMIDE 50 MG/5ML IV SOSY
PREFILLED_SYRINGE | INTRAVENOUS | Status: DC | PRN
  Administered 2018-03-20: 30 mg via INTRAVENOUS

## 2018-03-20 MED ORDER — LIDOCAINE 2% (20 MG/ML) 5 ML SYRINGE
INTRAMUSCULAR | Status: AC
  Filled 2018-03-20: qty 5

## 2018-03-20 MED ORDER — FENTANYL CITRATE (PF) 250 MCG/5ML IJ SOLN
INTRAMUSCULAR | Status: AC
  Filled 2018-03-20: qty 5

## 2018-03-20 MED ORDER — FENTANYL CITRATE (PF) 100 MCG/2ML IJ SOLN
25.0000 ug | INTRAMUSCULAR | Status: DC | PRN
  Administered 2018-03-20: 25 ug via INTRAVENOUS
  Administered 2018-03-20: 50 ug via INTRAVENOUS

## 2018-03-20 MED ORDER — FENTANYL CITRATE (PF) 100 MCG/2ML IJ SOLN
INTRAMUSCULAR | Status: DC | PRN
  Administered 2018-03-20 (×3): 50 ug via INTRAVENOUS
  Administered 2018-03-20: 25 ug via INTRAVENOUS

## 2018-03-20 MED ORDER — LABETALOL HCL 5 MG/ML IV SOLN
INTRAVENOUS | Status: AC
  Filled 2018-03-20: qty 4

## 2018-03-20 MED ORDER — ALBUTEROL SULFATE HFA 108 (90 BASE) MCG/ACT IN AERS
INHALATION_SPRAY | RESPIRATORY_TRACT | Status: AC
  Filled 2018-03-20: qty 6.7

## 2018-03-20 MED ORDER — FENTANYL CITRATE (PF) 100 MCG/2ML IJ SOLN
INTRAMUSCULAR | Status: AC
  Filled 2018-03-20: qty 2

## 2018-03-20 MED ORDER — 0.9 % SODIUM CHLORIDE (POUR BTL) OPTIME
TOPICAL | Status: DC | PRN
  Administered 2018-03-20: 1000 mL

## 2018-03-20 MED ORDER — CEFAZOLIN SODIUM-DEXTROSE 2-4 GM/100ML-% IV SOLN
2.0000 g | INTRAVENOUS | Status: AC
  Administered 2018-03-20: 2 g via INTRAVENOUS

## 2018-03-20 MED ORDER — ALBUTEROL SULFATE HFA 108 (90 BASE) MCG/ACT IN AERS
INHALATION_SPRAY | RESPIRATORY_TRACT | Status: DC | PRN
  Administered 2018-03-20: 6 via RESPIRATORY_TRACT

## 2018-03-20 MED ORDER — LACTATED RINGERS IV SOLN
INTRAVENOUS | Status: DC
  Administered 2018-03-20: 08:00:00 via INTRAVENOUS

## 2018-03-20 MED ORDER — OXYCODONE-ACETAMINOPHEN 5-325 MG PO TABS
ORAL_TABLET | ORAL | Status: AC
  Filled 2018-03-20: qty 2

## 2018-03-20 MED ORDER — ONDANSETRON HCL 4 MG/2ML IJ SOLN
INTRAMUSCULAR | Status: DC | PRN
  Administered 2018-03-20: 4 mg via INTRAVENOUS

## 2018-03-20 MED ORDER — MIDAZOLAM HCL 5 MG/5ML IJ SOLN
INTRAMUSCULAR | Status: DC | PRN
  Administered 2018-03-20: 2 mg via INTRAVENOUS

## 2018-03-20 MED ORDER — PROPOFOL 10 MG/ML IV BOLUS
INTRAVENOUS | Status: DC | PRN
  Administered 2018-03-20: 150 mg via INTRAVENOUS

## 2018-03-20 MED ORDER — SUGAMMADEX SODIUM 200 MG/2ML IV SOLN
INTRAVENOUS | Status: DC | PRN
  Administered 2018-03-20: 131.6 mg via INTRAVENOUS

## 2018-03-20 MED ORDER — OXYCODONE-ACETAMINOPHEN 5-325 MG PO TABS: 1.0000 | ORAL_TABLET | Freq: Three times a day (TID) | ORAL | 0 refills | Status: DC | PRN

## 2018-03-20 MED ORDER — PROMETHAZINE HCL 25 MG/ML IJ SOLN: 6.2500 mg | INTRAMUSCULAR | Status: DC | PRN

## 2018-03-20 MED ORDER — ONDANSETRON HCL 4 MG PO TABS: 4.0000 mg | ORAL_TABLET | Freq: Four times a day (QID) | ORAL | 0 refills | Status: DC | PRN

## 2018-03-20 MED ORDER — OXYCODONE-ACETAMINOPHEN 5-325 MG PO TABS
1.0000 | ORAL_TABLET | Freq: Once | ORAL | Status: AC
  Administered 2018-03-20: 2 via ORAL

## 2018-03-20 MED ORDER — DEXAMETHASONE SODIUM PHOSPHATE 10 MG/ML IJ SOLN
INTRAMUSCULAR | Status: DC | PRN
  Administered 2018-03-20: 10 mg via INTRAVENOUS

## 2018-03-20 MED ORDER — LIDOCAINE 2% (20 MG/ML) 5 ML SYRINGE
INTRAMUSCULAR | Status: DC | PRN
  Administered 2018-03-20: 60 mg via INTRAVENOUS

## 2018-03-20 MED ORDER — DEXAMETHASONE SODIUM PHOSPHATE 10 MG/ML IJ SOLN
INTRAMUSCULAR | Status: AC
  Filled 2018-03-20: qty 1

## 2018-03-20 MED ORDER — CHLORHEXIDINE GLUCONATE 4 % EX LIQD: 60.0000 mL | Freq: Once | CUTANEOUS | Status: DC

## 2018-03-20 MED ORDER — CEFAZOLIN SODIUM-DEXTROSE 2-4 GM/100ML-% IV SOLN
INTRAVENOUS | Status: AC
  Filled 2018-03-20: qty 100

## 2018-03-20 MED ORDER — MIDAZOLAM HCL 2 MG/2ML IJ SOLN
INTRAMUSCULAR | Status: AC
  Filled 2018-03-20: qty 2

## 2018-03-20 SURGICAL SUPPLY — 43 items
BANDAGE ACE 4X5 VEL STRL LF (GAUZE/BANDAGES/DRESSINGS) ×3 IMPLANT
BANDAGE ACE 6X5 VEL STRL LF (GAUZE/BANDAGES/DRESSINGS) ×3 IMPLANT
BANDAGE ELASTIC 4 VELCRO ST LF (GAUZE/BANDAGES/DRESSINGS) ×2 IMPLANT
BANDAGE ELASTIC 6 VELCRO ST LF (GAUZE/BANDAGES/DRESSINGS) ×2 IMPLANT
BNDG GAUZE ELAST 4 BULKY (GAUZE/BANDAGES/DRESSINGS) ×4 IMPLANT
BRUSH SCRUB SURG 4.25 DISP (MISCELLANEOUS) ×6 IMPLANT
COVER SURGICAL LIGHT HANDLE (MISCELLANEOUS) ×6 IMPLANT
DRAPE C-ARM 42X72 X-RAY (DRAPES) IMPLANT
DRAPE C-ARMOR (DRAPES) ×3 IMPLANT
DRAPE U-SHAPE 47X51 STRL (DRAPES) ×3 IMPLANT
DRSG ADAPTIC 3X8 NADH LF (GAUZE/BANDAGES/DRESSINGS) ×3 IMPLANT
DRSG PAD ABDOMINAL 8X10 ST (GAUZE/BANDAGES/DRESSINGS) ×2 IMPLANT
ELECT REM PT RETURN 9FT ADLT (ELECTROSURGICAL) ×3
ELECTRODE REM PT RTRN 9FT ADLT (ELECTROSURGICAL) ×1 IMPLANT
GAUZE SPONGE 4X4 12PLY STRL (GAUZE/BANDAGES/DRESSINGS) ×3 IMPLANT
GLOVE BIO SURGEON STRL SZ7.5 (GLOVE) ×3 IMPLANT
GLOVE BIO SURGEON STRL SZ8 (GLOVE) ×3 IMPLANT
GLOVE BIOGEL PI IND STRL 6.5 (GLOVE) IMPLANT
GLOVE BIOGEL PI IND STRL 7.5 (GLOVE) ×1 IMPLANT
GLOVE BIOGEL PI IND STRL 8 (GLOVE) ×1 IMPLANT
GLOVE BIOGEL PI INDICATOR 6.5 (GLOVE) ×2
GLOVE BIOGEL PI INDICATOR 7.5 (GLOVE) ×2
GLOVE BIOGEL PI INDICATOR 8 (GLOVE) ×2
GLOVE SURG SS PI 8.0 STRL IVOR (GLOVE) ×4 IMPLANT
GOWN STRL REUS W/ TWL LRG LVL3 (GOWN DISPOSABLE) ×2 IMPLANT
GOWN STRL REUS W/ TWL XL LVL3 (GOWN DISPOSABLE) ×1 IMPLANT
GOWN STRL REUS W/TWL LRG LVL3 (GOWN DISPOSABLE) ×6
GOWN STRL REUS W/TWL XL LVL3 (GOWN DISPOSABLE) ×3
KIT BASIN OR (CUSTOM PROCEDURE TRAY) ×3 IMPLANT
KIT TURNOVER KIT B (KITS) ×3 IMPLANT
MANIFOLD NEPTUNE II (INSTRUMENTS) ×3 IMPLANT
NS IRRIG 1000ML POUR BTL (IV SOLUTION) ×3 IMPLANT
PACK ORTHO EXTREMITY (CUSTOM PROCEDURE TRAY) ×3 IMPLANT
PAD ARMBOARD 7.5X6 YLW CONV (MISCELLANEOUS) ×6 IMPLANT
PAD CAST 4YDX4 CTTN HI CHSV (CAST SUPPLIES) IMPLANT
PADDING CAST COTTON 4X4 STRL (CAST SUPPLIES) ×3
PADDING CAST COTTON 6X4 STRL (CAST SUPPLIES) ×5 IMPLANT
SPONGE LAP 18X18 X RAY DECT (DISPOSABLE) ×3 IMPLANT
STAPLER VISISTAT 35W (STAPLE) IMPLANT
TOWEL OR 17X24 6PK STRL BLUE (TOWEL DISPOSABLE) ×6 IMPLANT
TOWEL OR 17X26 10 PK STRL BLUE (TOWEL DISPOSABLE) ×6 IMPLANT
UNDERPAD 30X30 (UNDERPADS AND DIAPERS) ×3 IMPLANT
WATER STERILE IRR 1000ML POUR (IV SOLUTION) ×6 IMPLANT

## 2018-03-20 NOTE — Progress Notes (Signed)
Orthopedic Tech Progress Note Patient Details:  Riki Ruskamela D Khiev 1956-07-31 [id number]  Ortho Devices Type of Ortho Device: CAM walker   Post Interventions Instructions Provided: Care of device   Saul FordyceJennifer C Montavis Schubring 03/20/2018, 9:51 AM

## 2018-03-20 NOTE — Anesthesia Procedure Notes (Signed)
Procedure Name: Intubation Date/Time: 03/20/2018 8:20 AM Performed by: Scheryl Darter, CRNA Pre-anesthesia Checklist: Patient identified, Emergency Drugs available, Suction available and Patient being monitored Patient Re-evaluated:Patient Re-evaluated prior to induction Oxygen Delivery Method: Circle System Utilized Preoxygenation: Pre-oxygenation with 100% oxygen Induction Type: IV induction Ventilation: Mask ventilation without difficulty Laryngoscope Size: Mac and 3 Grade View: Grade I Tube type: Oral Number of attempts: 1 Airway Equipment and Method: Stylet and Oral airway Placement Confirmation: ETT inserted through vocal cords under direct vision,  positive ETCO2 and breath sounds checked- equal and bilateral Secured at: 22 cm Tube secured with: Tape Dental Injury: Teeth and Oropharynx as per pre-operative assessment  Comments: Intubated by [facility]

## 2018-03-20 NOTE — Op Note (Signed)
03/20/2018  9:08 AM  PATIENT:  Emily RuskPamela D Landry  62 y.o. female  PRE-OPERATIVE DIAGNOSIS:   1. RETAINED EXTERNALFIXATOR LEFT ANKLE 2. ULCERATED PIN SITES LEG AND HEEL  POST-OPERATIVE DIAGNOSIS:   1. RETAINED EXTERNALFIXATOR LEFT ANKLE 2. ULCERATED PIN SITES LEG AND HEEL  PROCEDURE:  Procedure(s): 1. REMOVAL EXTERNAL FIXATION  LEFT LEG (Left) 2. DEBRIDEMENT OF ULCERATED PIN SITES  SURGEON:  Surgeon(s) and Role:    * Myrene GalasHandy, Raymona Boss, MD - Primary  PHYSICIAN ASSISTANT: Montez MoritaKEITH PAUL, PA-C  ANESTHESIA:   general  EBL:  5 mL   BLOOD ADMINISTERED:none  DRAINS: none   LOCAL MEDICATIONS USED:  NONE  SPECIMEN:  No Specimen  DISPOSITION OF SPECIMEN:  N/A  COUNTS:  YES  TOURNIQUET:  * Missing tourniquet times found for documented tourniquets in log: [id number] *  DICTATION: .Note written in EPIC  PLAN OF CARE: Discharge to home after PACU  PATIENT DISPOSITION:  PACU - hemodynamically stable.   Delay start of Pharmacological VTE agent (>24hrs) due to surgical blood loss or risk of bleeding: no  INDICATIONS: Patient is s/p external fixation of left trimalleolar fracture dislocation. Has gone on to heal and now presents for removal. I discussed with the patient the risks and benefits of surgery, including the possibility of infection, nerve injury, vessel injury, wound breakdown, arthritis, symptomatic hardware, DVT/ PE, loss of motion, malunion, nonunion, and need for further surgery among others.  She acknowledged these risks and wished to proceed.  BRIEF SUMMARY OF PROCEDURE: After administration of preoperative antibiotics the patient was taken to the operating room and general anesthesia induced. A time out was held. The fixator clamps were then loosened and the pins removed, followed by a thorough scrubbing with chlorhexidine and wash. The pin sites, which had ulcerated at the leg and heel around the pins and to a lesser extent at the metatarsals, were then debrided with curettes  at the skin, subcutaneous tissues, muscle fascia layers, as well as the near and far bone cortices, removing all discernible devitalized necrotic tissue, bone debris, and desiccated fibrinous material. The calcaneal pin tract was debrided with curettage all the way through the bone canal to the opposite side of the calcaneus. This canal and all pin sites were thoroughly irrigated with saline.   C-arm was then brought in and AP, mortise, and lateral views obtained of the ankle to confirm healing and maintenance of reduction. I also applied lateral and external rotation force in the mortise projection while using live fluoro x-ray to confirm that there was no syndesmotic instability.   The wounds were then dressed with adaptic, gauze, softly compressive dressing followed by a posterior and stirrup splint. Montez MoritaKeith Paul, PA-C, was present and assisting throughout.  PROGNOSIS: Patient will be NWB in a splint until follow up in the office in 10 days at which time we anticipate transitioning into a CAM boot and beginning WBAT. No formal DVT prophylaxis is required given anticipated mobilization and time from initial surgery and injury.  Myrene GalasMichael Maegan Buller, MD [facility], [facility] [phone]

## 2018-03-20 NOTE — Anesthesia Postprocedure Evaluation (Signed)
Anesthesia Post Note  Patient: Emily Bishop  Procedure(s) Performed: REMOVAL EXTERNAL FIXATION  LEFT LEG (Left )     Patient location during evaluation: PACU Anesthesia Type: General Level of consciousness: awake and alert Pain management: pain level controlled Vital Signs Assessment: post-procedure vital signs reviewed and stable Respiratory status: spontaneous breathing, nonlabored ventilation, respiratory function stable and patient connected to nasal cannula oxygen Cardiovascular status: blood pressure returned to baseline and stable Postop Assessment: no apparent nausea or vomiting Anesthetic complications: no    Last Vitals:  Vitals:   03/20/18 0945 03/20/18 0950  BP:  134/78  Pulse: (!) 115 93  Resp: 19   Temp:    SpO2: 94% 90%    Last Pain:  Vitals:   03/20/18 0909  TempSrc:   PainSc: 0-No pain                 Kennieth RadFitzgerald, Elida Harbin E

## 2018-03-20 NOTE — Discharge Instructions (Addendum)
[facility] Discharge Instructions   General Discharge Instructions  WEIGHT BEARING STATUS: Nonweightbearing Left Leg  RANGE OF MOTION/ACTIVITY: ok to move knee and toes. Do not remove splint. Continue to ice and elevate for swelling and pain control   Wound Care: do not remove splint. Do not get splint wet. Keep splint as clean as possible  Ok to shower but cover splint with garbage bag or cast cover (typically found at [facility] or similar). If using a garbage bag tape it around the top to prevent water from getting in.   DVT/PE prophylaxis: aspirin 325 mg po daily x 4 weeks   Diet: as you were eating previously.  Can use over the counter stool softeners and bowel preparations, such as Miralax, to help with bowel movements.  Narcotics can be constipating.  Be sure to drink plenty of fluids  PAIN MEDICATION USE AND EXPECTATIONS  You have likely been given narcotic medications to help control your pain.  After a traumatic event that results in an fracture (broken bone) with or without surgery, it is ok to use narcotic pain medications to help control one's pain.  We understand that everyone responds to pain differently and each individual patient will be evaluated on a regular basis for the continued need for narcotic medications. Ideally, narcotic medication use should last no more than 6-8 weeks (coinciding with fracture healing).   As a patient it is your responsibility as well to monitor narcotic medication use and report the amount and frequency you use these medications when you come to your office visit.   We would also advise that if you are using narcotic medications, you should take a dose prior to therapy to maximize you participation.  IF YOU ARE ON NARCOTIC MEDICATIONS IT IS NOT PERMISSIBLE TO OPERATE A MOTOR VEHICLE (MOTORCYCLE/CAR/TRUCK/MOPED) OR HEAVY MACHINERY DO NOT MIX NARCOTICS WITH OTHER CNS (CENTRAL NERVOUS SYSTEM) DEPRESSANTS SUCH AS ALCOHOL   STOP  SMOKING OR USING NICOTINE PRODUCTS!!!!  As discussed nicotine severely impairs your body's ability to heal surgical and traumatic wounds but also impairs bone healing.  Wounds and bone heal by forming microscopic blood vessels (angiogenesis) and nicotine is a vasoconstrictor (essentially, shrinks blood vessels).  Therefore, if vasoconstriction occurs to these microscopic blood vessels they essentially disappear and are unable to deliver necessary nutrients to the healing tissue.  This is one modifiable factor that you can do to dramatically increase your chances of healing your injury.    (This means no smoking, no nicotine gum, patches, etc)  DO NOT USE NONSTEROIDAL ANTI-INFLAMMATORY DRUGS (NSAID'S)  Using products such as Advil (ibuprofen), Aleve (naproxen), Motrin (ibuprofen) for additional pain control during fracture healing can delay and/or prevent the healing response.  If you would like to take over the counter (OTC) medication, Tylenol (acetaminophen) is ok.  However, some narcotic medications that are given for pain control contain acetaminophen as well. Therefore, you should not exceed more than 4000 mg of tylenol in a day if you do not have liver disease.  Also note that there are may OTC medicines, such as cold medicines and allergy medicines that my contain tylenol as well.  If you have any questions about medications and/or interactions please ask your doctor/PA or your pharmacist.      ICE AND ELEVATE INJURED/OPERATIVE EXTREMITY  Using ice and elevating the injured extremity above your heart can help with swelling and pain control.  Icing in a pulsatile fashion, such as 20 minutes on and 20 minutes off,  can be followed.    Do not place ice directly on skin. Make sure there is a barrier between to skin and the ice pack.    Using frozen items such as frozen peas works well as the conform nicely to the are that needs to be iced.  USE AN ACE WRAP OR TED HOSE FOR SWELLING CONTROL  In  addition to icing and elevation, Ace wraps or TED hose are used to help limit and resolve swelling.  It is recommended to use Ace wraps or TED hose until you are informed to stop.    When using Ace Wraps start the wrapping distally (farthest away from the body) and wrap proximally (closer to the body)   Example: If you had surgery on your leg or thing and you do not have a splint on, start the ace wrap at the toes and work your way up to the thigh        If you had surgery on your upper extremity and do not have a splint on, start the ace wrap at your fingers and work your way up to the upper arm  IF YOU ARE IN A SPLINT OR CAST DO NOT REMOVE IT FOR ANY REASON   If your splint gets wet for any reason please contact the office immediately. You may shower in your splint or cast as long as you keep it dry.  This can be done by wrapping in a cast cover or garbage back (or similar)  Do Not stick any thing down your splint or cast such as pencils, money, or hangers to try and scratch yourself with.  If you feel itchy take benadryl as prescribed on the bottle for itching  IF YOU ARE IN A CAM BOOT (BLACK BOOT)  You may remove boot periodically. Perform daily dressing changes as noted below.  Wash the liner of the boot regularly and wear a sock when wearing the boot. It is recommended that you sleep in the boot until told otherwise  CALL THE OFFICE WITH ANY QUESTIONS OR CONCERNS: [phone]      Cast or Splint Care, Adult Casts and splints are supports that are worn to protect broken bones and other injuries. A cast or splint may hold a bone still and in the correct position while it heals. Casts and splints may also help to ease pain, swelling, and muscle spasms. How to care for your cast  Do not stick anything inside the cast to scratch your skin.  Check the skin around the cast every day. Tell your doctor about any concerns.  You may put lotion on dry skin around the edges of the cast. Do not put  lotion on the skin under the cast.  Keep the cast clean.  If the cast is not waterproof: ? Do not let it get wet. ? Cover it with a watertight covering when you take a bath or a shower. How to care for your splint  Wear it as told by your doctor. Take it off only as told by your doctor.  Loosen the splint if your fingers or toes tingle, get numb, or turn cold and blue.  Keep the splint clean.  If the splint is not waterproof: ? Do not let it get wet. ? Cover it with a watertight covering when you take a bath or a shower. Follow these instructions at home: Bathing  Do not take baths or swim until your doctor says it is okay. Ask your doctor if  you can take showers. You may only be allowed to take sponge baths for bathing.  If your cast or splint is not waterproof, cover it with a watertight covering when you take a bath or shower. Managing pain, stiffness, and swelling  Move your fingers or toes often to avoid stiffness and to lessen swelling.  Raise (elevate) the injured area above the level of your heart while sitting or lying down. Safety  Do not use the injured limb to support your body weight until your doctor says that it is okay.  Use crutches or other assistive devices as told by your doctor. General instructions  Do not put pressure on any part of the cast or splint until it is fully hardened. This may take many hours.  Return to your normal activities as told by your doctor. Ask your doctor what activities are safe for you.  Keep all follow-up visits as told by your doctor. This is important. Contact a doctor if:  Your cast or splint gets damaged.  The skin around the cast gets red or raw.  The skin under the cast is very itchy or painful.  Your cast or splint feels very uncomfortable.  Your cast or splint is too tight or too loose.  Your cast becomes wet or it starts to have a soft spot or area.  You get an object stuck under your cast. Get help right  away if:  Your pain gets worse.  The injured area tingles, gets numb, or turns blue and cold.  The part of your body above or below the cast is swollen and it turns a different color (is discolored).  You cannot feel or move your fingers or toes.  There is fluid leaking through the cast.  You have very bad pain or pressure under the cast.  You have trouble breathing.  You have shortness of breath.  You have chest pain. This information is not intended to replace advice given to you by your health care provider. Make sure you discuss any questions you have with your health care provider. Document Released: 11/21/2010 Document Revised: 07/12/2016 Document Reviewed: 07/12/2016 Elsevier Interactive Patient Education  2017 [employer]   General Anesthesia, Adult, Care After These instructions provide you with information about caring for yourself after your procedure. Your health care provider may also give you more specific instructions. Your treatment has been planned according to current medical practices, but problems sometimes occur. Call your health care provider if you have any problems or questions after your procedure. What can I expect after the procedure? After the procedure, it is common to have:  Vomiting.  A sore throat.  Mental slowness.  It is common to feel:  Nauseous.  Cold or shivery.  Sleepy.  Tired.  Sore or achy, even in parts of your body where you did not have surgery.  Follow these instructions at home: For at least 24 hours after the procedure:  Do not: ? Participate in activities where you could fall or become injured. ? Drive. ? Use heavy machinery. ? Drink alcohol. ? Take sleeping pills or medicines that cause drowsiness. ? Make important decisions or sign legal documents. ? Take care of children on your own.  Rest. Eating and drinking  If you vomit, drink water, juice, or soup when you can drink without vomiting.  Drink  enough fluid to keep your urine clear or pale yellow.  Make sure you have little or no nausea before eating solid foods.  Follow  the diet recommended by your health care provider. General instructions  Have a responsible adult stay with you until you are awake and alert.  Return to your normal activities as told by your health care provider. Ask your health care provider what activities are safe for you.  Take over-the-counter and prescription medicines only as told by your health care provider.  If you smoke, do not smoke without supervision.  Keep all follow-up visits as told by your health care provider. This is important. Contact a health care provider if:  You continue to have nausea or vomiting at home, and medicines are not helpful.  You cannot drink fluids or start eating again.  You cannot urinate after 8-12 hours.  You develop a skin rash.  You have fever.  You have increasing redness at the site of your procedure. Get help right away if:  You have difficulty breathing.  You have chest pain.  You have unexpected bleeding.  You feel that you are having a life-threatening or urgent problem. This information is not intended to replace advice given to you by your health care provider. Make sure you discuss any questions you have with your health care provider. Document Released: 10/28/2000 Document Revised: 12/25/2015 Document Reviewed: 07/06/2015 Elsevier Interactive Patient Education  [employer].

## 2018-03-20 NOTE — Transfer of Care (Signed)
Immediate Anesthesia Transfer of Care Note  Patient: Emily Bishop  Procedure(s) Performed: REMOVAL EXTERNAL FIXATION  LEFT LEG (Left )  Patient Location: PACU  Anesthesia Type:General  Level of Consciousness: awake, alert  and oriented  Airway & Oxygen Therapy: Patient Spontanous Breathing  Post-op Assessment: Report given to RN, Post -op Vital signs reviewed and stable and Patient moving all extremities X 4  Post vital signs: Reviewed and stable  Last Vitals:  Vitals Value Taken Time  BP 162/106 03/20/2018  9:06 AM  Temp    Pulse 128 03/20/2018  9:08 AM  Resp 28 03/20/2018  9:08 AM  SpO2 96 % 03/20/2018  9:08 AM  Vitals shown include unvalidated device data.  Last Pain:  Vitals:   03/20/18 0630  TempSrc:   PainSc: 0-No pain      Patients Stated Pain Goal: 3 (03/20/18 0630)  Complications: No apparent anesthesia complications

## 2018-03-21 ENCOUNTER — Encounter (HOSPITAL_COMMUNITY): Payer: Self-pay | Admitting: Orthopedic Surgery

## 2018-04-08 DIAGNOSIS — S82852D Displaced trimalleolar fracture of left lower leg, subsequent encounter for closed fracture with routine healing: Secondary | ICD-10-CM | POA: Diagnosis not present

## 2018-05-06 DIAGNOSIS — S82852D Displaced trimalleolar fracture of left lower leg, subsequent encounter for closed fracture with routine healing: Secondary | ICD-10-CM | POA: Diagnosis not present

## 2018-06-29 DIAGNOSIS — S82852D Displaced trimalleolar fracture of left lower leg, subsequent encounter for closed fracture with routine healing: Secondary | ICD-10-CM | POA: Diagnosis not present

## 2018-09-03 DIAGNOSIS — I2510 Atherosclerotic heart disease of native coronary artery without angina pectoris: Secondary | ICD-10-CM | POA: Diagnosis not present

## 2018-09-03 DIAGNOSIS — Z0000 Encounter for general adult medical examination without abnormal findings: Secondary | ICD-10-CM | POA: Diagnosis not present

## 2018-09-03 DIAGNOSIS — I10 Essential (primary) hypertension: Secondary | ICD-10-CM | POA: Diagnosis not present

## 2018-09-03 DIAGNOSIS — Z23 Encounter for immunization: Secondary | ICD-10-CM | POA: Diagnosis not present

## 2018-09-03 DIAGNOSIS — J449 Chronic obstructive pulmonary disease, unspecified: Secondary | ICD-10-CM | POA: Diagnosis not present

## 2018-09-08 DIAGNOSIS — J069 Acute upper respiratory infection, unspecified: Secondary | ICD-10-CM | POA: Diagnosis not present

## 2018-09-08 DIAGNOSIS — Z6821 Body mass index (BMI) 21.0-21.9, adult: Secondary | ICD-10-CM | POA: Diagnosis not present

## 2018-09-08 DIAGNOSIS — J441 Chronic obstructive pulmonary disease with (acute) exacerbation: Secondary | ICD-10-CM | POA: Diagnosis not present

## 2018-09-09 ENCOUNTER — Other Ambulatory Visit: Payer: Self-pay | Admitting: Nurse Practitioner

## 2018-09-09 DIAGNOSIS — R5381 Other malaise: Secondary | ICD-10-CM

## 2018-09-10 ENCOUNTER — Other Ambulatory Visit: Payer: Self-pay | Admitting: Nurse Practitioner

## 2018-09-10 DIAGNOSIS — M8580 Other specified disorders of bone density and structure, unspecified site: Secondary | ICD-10-CM

## 2019-03-08 DIAGNOSIS — E782 Mixed hyperlipidemia: Secondary | ICD-10-CM | POA: Diagnosis not present

## 2019-03-08 DIAGNOSIS — Z79899 Other long term (current) drug therapy: Secondary | ICD-10-CM | POA: Diagnosis not present

## 2019-03-08 DIAGNOSIS — E559 Vitamin D deficiency, unspecified: Secondary | ICD-10-CM | POA: Diagnosis not present

## 2019-03-24 DIAGNOSIS — I10 Essential (primary) hypertension: Secondary | ICD-10-CM | POA: Diagnosis not present

## 2019-03-24 DIAGNOSIS — F419 Anxiety disorder, unspecified: Secondary | ICD-10-CM | POA: Diagnosis not present

## 2019-03-24 DIAGNOSIS — J449 Chronic obstructive pulmonary disease, unspecified: Secondary | ICD-10-CM | POA: Diagnosis not present

## 2019-03-24 DIAGNOSIS — I2510 Atherosclerotic heart disease of native coronary artery without angina pectoris: Secondary | ICD-10-CM | POA: Diagnosis not present

## 2019-04-21 DIAGNOSIS — Z20828 Contact with and (suspected) exposure to other viral communicable diseases: Secondary | ICD-10-CM | POA: Diagnosis not present

## 2019-04-28 DIAGNOSIS — Z1331 Encounter for screening for depression: Secondary | ICD-10-CM | POA: Diagnosis not present

## 2019-04-28 DIAGNOSIS — F419 Anxiety disorder, unspecified: Secondary | ICD-10-CM | POA: Diagnosis not present

## 2019-04-28 DIAGNOSIS — Z6821 Body mass index (BMI) 21.0-21.9, adult: Secondary | ICD-10-CM | POA: Diagnosis not present

## 2019-07-12 DIAGNOSIS — S82852D Displaced trimalleolar fracture of left lower leg, subsequent encounter for closed fracture with routine healing: Secondary | ICD-10-CM | POA: Diagnosis not present

## 2019-07-12 DIAGNOSIS — M19172 Post-traumatic osteoarthritis, left ankle and foot: Secondary | ICD-10-CM | POA: Diagnosis not present

## 2019-08-11 DIAGNOSIS — S82852D Displaced trimalleolar fracture of left lower leg, subsequent encounter for closed fracture with routine healing: Secondary | ICD-10-CM | POA: Diagnosis not present

## 2019-08-11 DIAGNOSIS — M19172 Post-traumatic osteoarthritis, left ankle and foot: Secondary | ICD-10-CM | POA: Diagnosis not present

## 2019-09-29 DIAGNOSIS — H61001 Unspecified perichondritis of right external ear: Secondary | ICD-10-CM | POA: Diagnosis not present

## 2019-09-30 DIAGNOSIS — Z20822 Contact with and (suspected) exposure to covid-19: Secondary | ICD-10-CM | POA: Diagnosis not present

## 2019-09-30 DIAGNOSIS — J441 Chronic obstructive pulmonary disease with (acute) exacerbation: Secondary | ICD-10-CM | POA: Diagnosis not present

## 2019-10-05 DIAGNOSIS — R6889 Other general symptoms and signs: Secondary | ICD-10-CM | POA: Diagnosis not present

## 2019-10-05 DIAGNOSIS — J441 Chronic obstructive pulmonary disease with (acute) exacerbation: Secondary | ICD-10-CM | POA: Diagnosis not present

## 2019-10-05 DIAGNOSIS — Z20822 Contact with and (suspected) exposure to covid-19: Secondary | ICD-10-CM | POA: Diagnosis not present

## 2019-10-28 ENCOUNTER — Ambulatory Visit: Payer: BC Managed Care – PPO | Attending: Internal Medicine

## 2019-10-28 DIAGNOSIS — Z23 Encounter for immunization: Secondary | ICD-10-CM

## 2019-10-28 NOTE — Progress Notes (Signed)
   Covid-19 Vaccination Clinic  Name:  SHELLA LAHMAN    MRN: [id number] DOB: 08-Feb-1956  10/28/2019  Ms. Montminy was observed post Covid-19 immunization for 15 minutes without incident. She was provided with Vaccine Information Sheet and instruction to access the V-Safe system.   Ms. Seeber was instructed to call 911 with any severe reactions post vaccine: Marland Kitchen Difficulty breathing  . Swelling of face and throat  . A fast heartbeat  . A bad rash all over body  . Dizziness and weakness   Immunizations Administered    Name Date Dose VIS Date Route   Pfizer COVID-19 Vaccine 10/28/2019 10:46 AM 0.3 mL 07/16/2019 Intramuscular   Manufacturer: [employer], [employer]   Lot: [id number]   NDC: [address]-3

## 2019-11-09 DIAGNOSIS — I2510 Atherosclerotic heart disease of native coronary artery without angina pectoris: Secondary | ICD-10-CM | POA: Diagnosis not present

## 2019-11-09 DIAGNOSIS — F419 Anxiety disorder, unspecified: Secondary | ICD-10-CM | POA: Diagnosis not present

## 2019-11-09 DIAGNOSIS — E782 Mixed hyperlipidemia: Secondary | ICD-10-CM | POA: Diagnosis not present

## 2019-11-09 DIAGNOSIS — I10 Essential (primary) hypertension: Secondary | ICD-10-CM | POA: Diagnosis not present

## 2019-11-09 DIAGNOSIS — R5383 Other fatigue: Secondary | ICD-10-CM | POA: Diagnosis not present

## 2019-11-09 DIAGNOSIS — J449 Chronic obstructive pulmonary disease, unspecified: Secondary | ICD-10-CM | POA: Diagnosis not present

## 2019-11-09 DIAGNOSIS — E559 Vitamin D deficiency, unspecified: Secondary | ICD-10-CM | POA: Diagnosis not present

## 2019-11-22 ENCOUNTER — Ambulatory Visit: Payer: BC Managed Care – PPO | Attending: Internal Medicine

## 2019-11-22 DIAGNOSIS — Z23 Encounter for immunization: Secondary | ICD-10-CM

## 2019-11-22 NOTE — Progress Notes (Signed)
   Covid-19 Vaccination Clinic  Name:  Emily Bishop    MRN: [id number] DOB: 1955/12/05  11/22/2019  [name] was observed post Covid-19 immunization for 15 minutes without incident. She was provided with Vaccine Information Sheet and instruction to access the V-Safe system.   [name] was instructed to call 911 with any severe reactions post vaccine: Marland Kitchen Difficulty breathing  . Swelling of face and throat  . A fast heartbeat  . A bad rash all over body  . Dizziness and weakness   Immunizations Administered    Name Date Dose VIS Date Route   Pfizer COVID-19 Vaccine 11/22/2019 10:54 AM 0.3 mL 09/29/2018 Intramuscular   Manufacturer: [employer], [employer]   Lot: [id number]   NDC: [address]-9

## 2019-12-15 DIAGNOSIS — Z681 Body mass index (BMI) 19 or less, adult: Secondary | ICD-10-CM | POA: Diagnosis not present

## 2019-12-15 DIAGNOSIS — M545 Low back pain: Secondary | ICD-10-CM | POA: Diagnosis not present

## 2019-12-20 ENCOUNTER — Ambulatory Visit
Admission: RE | Admit: 2019-12-20 | Discharge: 2019-12-20 | Disposition: A | Discharge status: Home / Self Care | Payer: BC Managed Care – PPO | Source: Ambulatory Visit | Attending: Nurse Practitioner | Admitting: Nurse Practitioner

## 2019-12-20 ENCOUNTER — Other Ambulatory Visit: Payer: Self-pay | Admitting: Nurse Practitioner

## 2019-12-20 DIAGNOSIS — M545 Low back pain, unspecified: Secondary | ICD-10-CM

## 2019-12-20 DIAGNOSIS — S3992XA Unspecified injury of lower back, initial encounter: Secondary | ICD-10-CM | POA: Diagnosis not present

## 2020-07-10 DIAGNOSIS — I10 Essential (primary) hypertension: Secondary | ICD-10-CM | POA: Diagnosis not present

## 2020-07-10 DIAGNOSIS — E559 Vitamin D deficiency, unspecified: Secondary | ICD-10-CM | POA: Diagnosis not present

## 2020-07-10 DIAGNOSIS — E782 Mixed hyperlipidemia: Secondary | ICD-10-CM | POA: Diagnosis not present

## 2020-07-10 DIAGNOSIS — F419 Anxiety disorder, unspecified: Secondary | ICD-10-CM | POA: Diagnosis not present

## 2020-07-10 DIAGNOSIS — Z1331 Encounter for screening for depression: Secondary | ICD-10-CM | POA: Diagnosis not present

## 2020-07-10 DIAGNOSIS — J449 Chronic obstructive pulmonary disease, unspecified: Secondary | ICD-10-CM | POA: Diagnosis not present

## 2020-07-10 DIAGNOSIS — Z20822 Contact with and (suspected) exposure to covid-19: Secondary | ICD-10-CM | POA: Diagnosis not present

## 2020-07-10 DIAGNOSIS — I2510 Atherosclerotic heart disease of native coronary artery without angina pectoris: Secondary | ICD-10-CM | POA: Diagnosis not present

## 2020-07-11 ENCOUNTER — Other Ambulatory Visit: Payer: Self-pay | Admitting: Nurse Practitioner

## 2020-07-11 DIAGNOSIS — Z1231 Encounter for screening mammogram for malignant neoplasm of breast: Secondary | ICD-10-CM

## 2020-07-20 DIAGNOSIS — Z20822 Contact with and (suspected) exposure to covid-19: Secondary | ICD-10-CM | POA: Diagnosis not present

## 2021-02-25 IMAGING — CR DG LUMBAR SPINE COMPLETE 4+V
5 series · 5 of 5 positions shown · non-contrast
Comparison: None.

CLINICAL DATA: Low back pain, recent fall

EXAM:
LUMBAR SPINE - COMPLETE 4+ VIEW

[t l-spine a.p.]
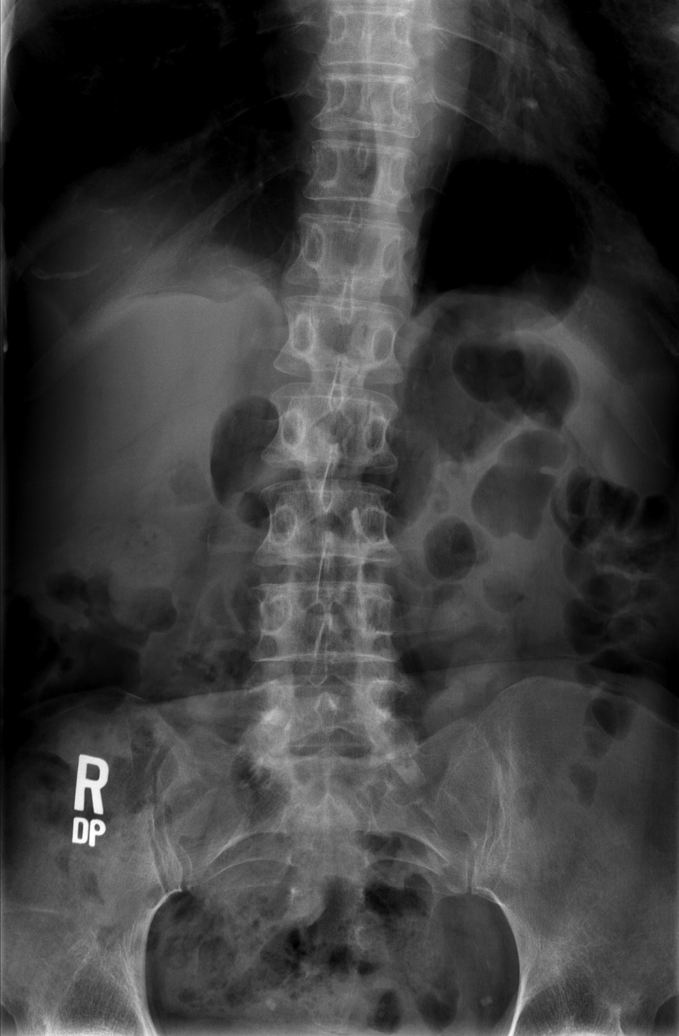

[t l-spine oblique exposure (1 of 2)]
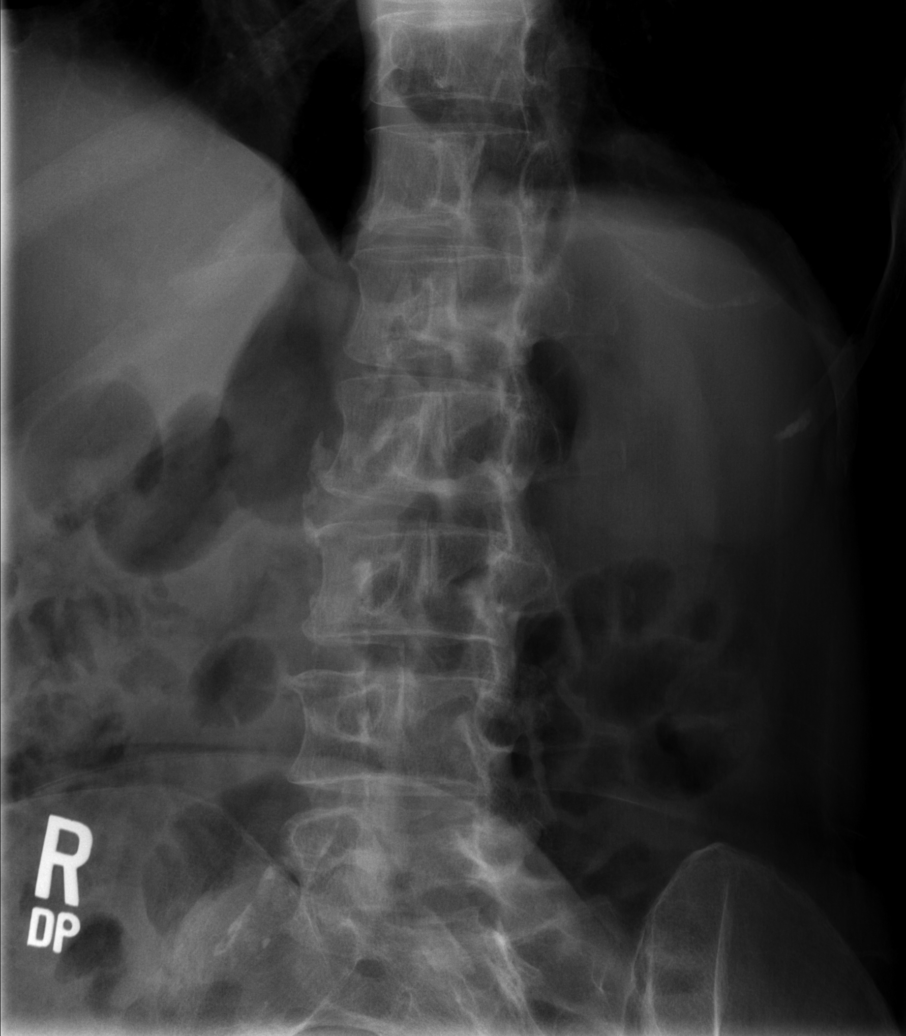

[t l-spine oblique exposure (2 of 2)]
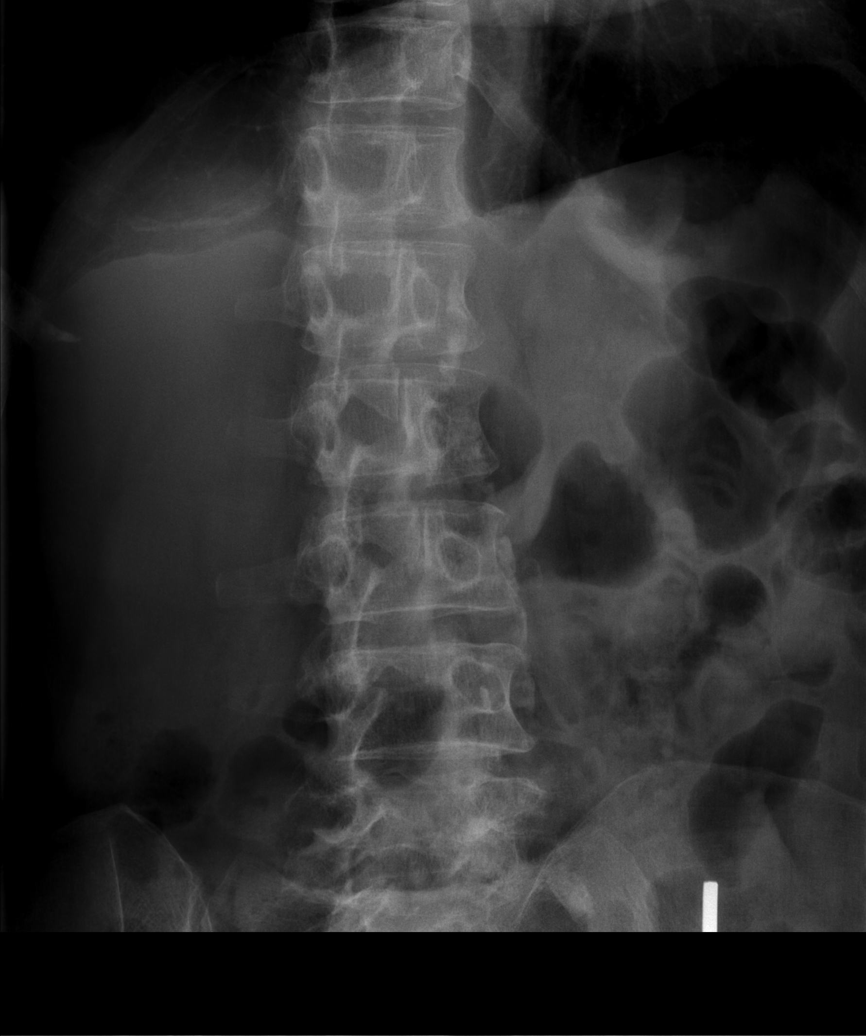

[t l-spine lat]
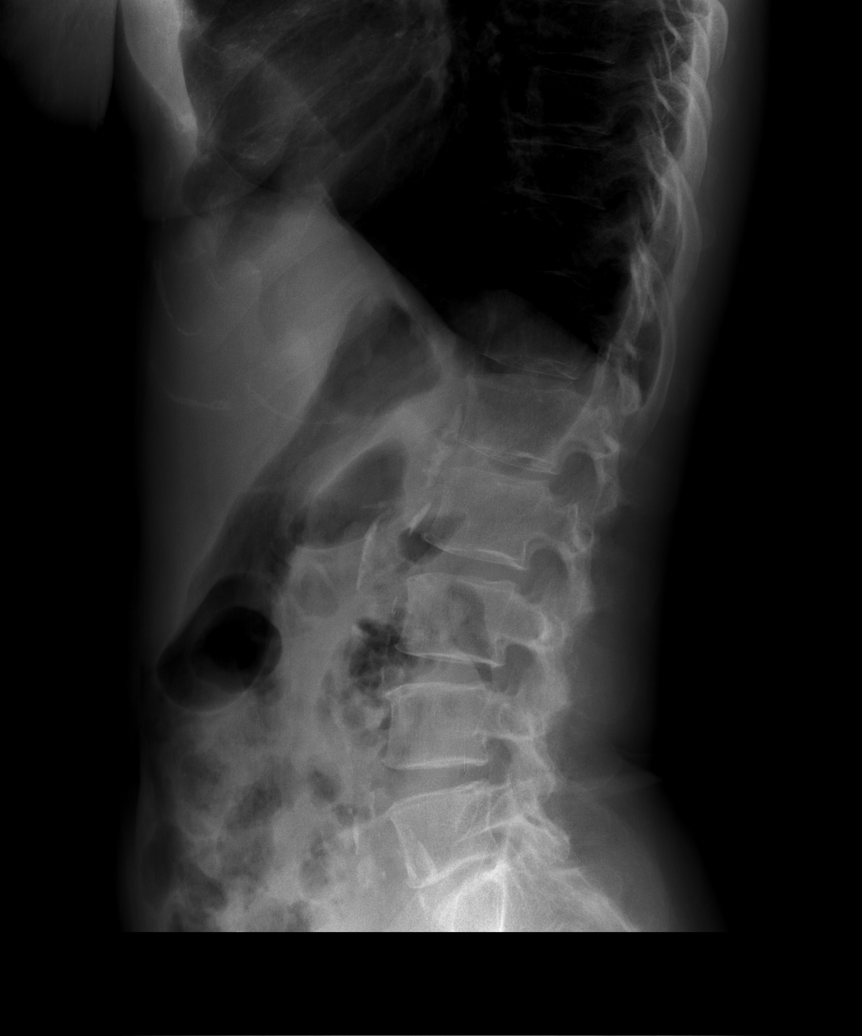

[t l-spine l5-s1 spot]
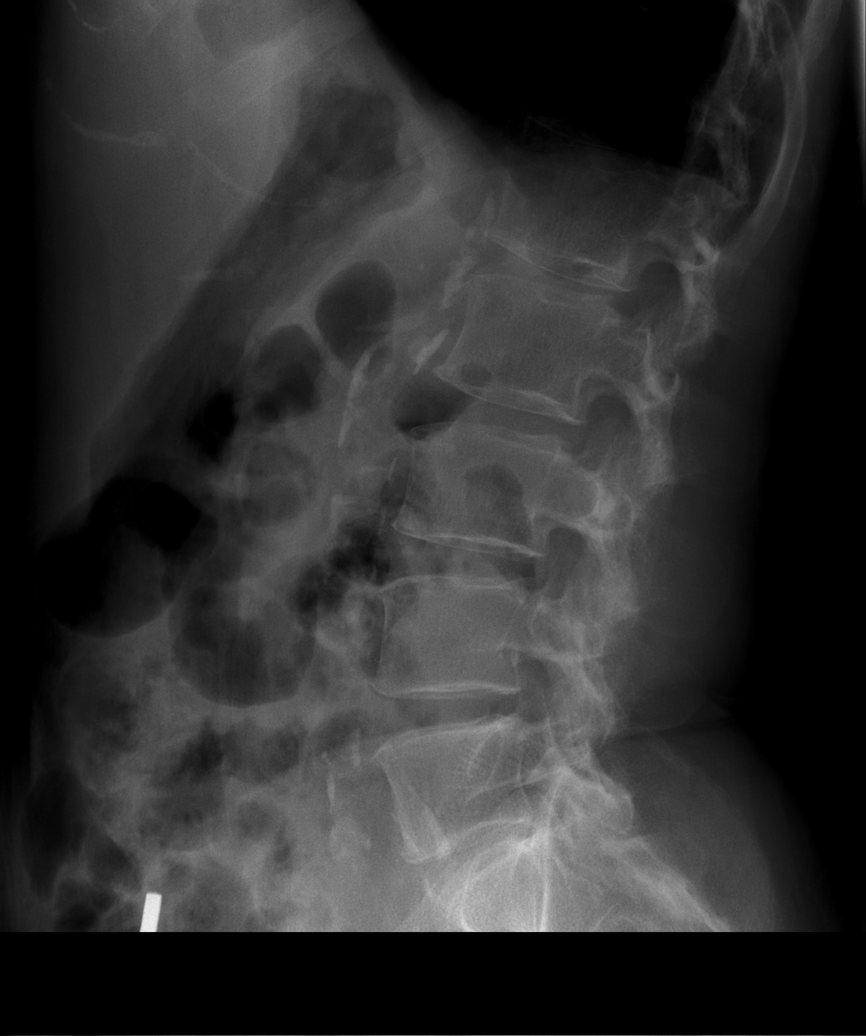

[5 of 5 positions shown; findings below may reference images not displayed]

FINDINGS: Lumbar vertebral body heights and alignment are maintained. No acute
fractures identified. Mild disc space narrowing, greatest at L5-S1.
There is lower lumbar facet hypertrophy.
IMPRESSION: No acute fracture identified.

## 2021-03-05 DIAGNOSIS — J441 Chronic obstructive pulmonary disease with (acute) exacerbation: Secondary | ICD-10-CM | POA: Diagnosis not present

## 2021-03-05 DIAGNOSIS — R059 Cough, unspecified: Secondary | ICD-10-CM | POA: Diagnosis not present

## 2021-03-05 DIAGNOSIS — R634 Abnormal weight loss: Secondary | ICD-10-CM | POA: Diagnosis not present

## 2021-03-05 DIAGNOSIS — J019 Acute sinusitis, unspecified: Secondary | ICD-10-CM | POA: Diagnosis not present

## 2021-03-05 DIAGNOSIS — Z20822 Contact with and (suspected) exposure to covid-19: Secondary | ICD-10-CM | POA: Diagnosis not present

## 2021-03-08 ENCOUNTER — Ambulatory Visit
Admission: RE | Admit: 2021-03-08 | Discharge: 2021-03-08 | Disposition: A | Discharge status: Home / Self Care | Payer: BC Managed Care – PPO | Source: Ambulatory Visit | Attending: Family Medicine | Admitting: Family Medicine

## 2021-03-08 ENCOUNTER — Other Ambulatory Visit: Payer: Self-pay | Admitting: Family Medicine

## 2021-03-08 ENCOUNTER — Other Ambulatory Visit: Payer: Self-pay

## 2021-03-08 DIAGNOSIS — R634 Abnormal weight loss: Secondary | ICD-10-CM

## 2021-03-08 DIAGNOSIS — R0602 Shortness of breath: Secondary | ICD-10-CM | POA: Diagnosis not present

## 2021-03-08 DIAGNOSIS — R059 Cough, unspecified: Secondary | ICD-10-CM | POA: Diagnosis not present

## 2021-03-16 DIAGNOSIS — I10 Essential (primary) hypertension: Secondary | ICD-10-CM | POA: Diagnosis not present

## 2021-03-16 DIAGNOSIS — E782 Mixed hyperlipidemia: Secondary | ICD-10-CM | POA: Diagnosis not present

## 2021-03-16 DIAGNOSIS — R634 Abnormal weight loss: Secondary | ICD-10-CM | POA: Diagnosis not present

## 2021-03-16 DIAGNOSIS — I2510 Atherosclerotic heart disease of native coronary artery without angina pectoris: Secondary | ICD-10-CM | POA: Diagnosis not present

## 2021-03-16 DIAGNOSIS — F419 Anxiety disorder, unspecified: Secondary | ICD-10-CM | POA: Diagnosis not present

## 2021-03-16 DIAGNOSIS — J449 Chronic obstructive pulmonary disease, unspecified: Secondary | ICD-10-CM | POA: Diagnosis not present

## 2021-03-16 DIAGNOSIS — E559 Vitamin D deficiency, unspecified: Secondary | ICD-10-CM | POA: Diagnosis not present

## 2021-03-21 ENCOUNTER — Other Ambulatory Visit: Payer: Self-pay | Admitting: Nurse Practitioner

## 2021-03-21 DIAGNOSIS — Z87891 Personal history of nicotine dependence: Secondary | ICD-10-CM

## 2021-03-27 DIAGNOSIS — Z124 Encounter for screening for malignant neoplasm of cervix: Secondary | ICD-10-CM | POA: Diagnosis not present

## 2021-03-27 DIAGNOSIS — Z681 Body mass index (BMI) 19 or less, adult: Secondary | ICD-10-CM | POA: Diagnosis not present

## 2021-04-18 ENCOUNTER — Ambulatory Visit
Admission: RE | Admit: 2021-04-18 | Discharge: 2021-04-18 | Disposition: A | Discharge status: Home / Self Care | Payer: BC Managed Care – PPO | Source: Ambulatory Visit | Attending: Nurse Practitioner | Admitting: Nurse Practitioner

## 2021-04-18 DIAGNOSIS — Z87891 Personal history of nicotine dependence: Secondary | ICD-10-CM

## 2021-05-23 ENCOUNTER — Emergency Department (HOSPITAL_COMMUNITY): Payer: BC Managed Care – PPO

## 2021-05-23 ENCOUNTER — Inpatient Hospital Stay (HOSPITAL_COMMUNITY)
Admission: EM | Admit: 2021-05-23 | Discharge: 2021-05-26 | DRG: 189 | Disposition: A | Discharge status: Home / Self Care | Payer: BC Managed Care – PPO | Attending: Internal Medicine | Admitting: Internal Medicine

## 2021-05-23 ENCOUNTER — Other Ambulatory Visit: Payer: Self-pay

## 2021-05-23 DIAGNOSIS — R634 Abnormal weight loss: Secondary | ICD-10-CM | POA: Diagnosis present

## 2021-05-23 DIAGNOSIS — F17210 Nicotine dependence, cigarettes, uncomplicated: Secondary | ICD-10-CM | POA: Diagnosis present

## 2021-05-23 DIAGNOSIS — Z20822 Contact with and (suspected) exposure to covid-19: Secondary | ICD-10-CM | POA: Diagnosis not present

## 2021-05-23 DIAGNOSIS — F1010 Alcohol abuse, uncomplicated: Secondary | ICD-10-CM | POA: Diagnosis present

## 2021-05-23 DIAGNOSIS — J439 Emphysema, unspecified: Secondary | ICD-10-CM | POA: Diagnosis not present

## 2021-05-23 DIAGNOSIS — I160 Hypertensive urgency: Secondary | ICD-10-CM | POA: Diagnosis present

## 2021-05-23 DIAGNOSIS — Z7982 Long term (current) use of aspirin: Secondary | ICD-10-CM

## 2021-05-23 DIAGNOSIS — Z681 Body mass index (BMI) 19 or less, adult: Secondary | ICD-10-CM

## 2021-05-23 DIAGNOSIS — I161 Hypertensive emergency: Secondary | ICD-10-CM | POA: Diagnosis present

## 2021-05-23 DIAGNOSIS — Z79899 Other long term (current) drug therapy: Secondary | ICD-10-CM | POA: Diagnosis not present

## 2021-05-23 DIAGNOSIS — J441 Chronic obstructive pulmonary disease with (acute) exacerbation: Secondary | ICD-10-CM | POA: Diagnosis not present

## 2021-05-23 DIAGNOSIS — Z7951 Long term (current) use of inhaled steroids: Secondary | ICD-10-CM

## 2021-05-23 DIAGNOSIS — R0602 Shortness of breath: Secondary | ICD-10-CM | POA: Diagnosis not present

## 2021-05-23 DIAGNOSIS — R64 Cachexia: Secondary | ICD-10-CM | POA: Diagnosis present

## 2021-05-23 DIAGNOSIS — B370 Candidal stomatitis: Secondary | ICD-10-CM | POA: Diagnosis not present

## 2021-05-23 DIAGNOSIS — F419 Anxiety disorder, unspecified: Secondary | ICD-10-CM | POA: Diagnosis present

## 2021-05-23 DIAGNOSIS — E7800 Pure hypercholesterolemia, unspecified: Secondary | ICD-10-CM | POA: Diagnosis not present

## 2021-05-23 DIAGNOSIS — J9601 Acute respiratory failure with hypoxia: Secondary | ICD-10-CM | POA: Diagnosis not present

## 2021-05-23 DIAGNOSIS — Z880 Allergy status to penicillin: Secondary | ICD-10-CM

## 2021-05-23 DIAGNOSIS — J449 Chronic obstructive pulmonary disease, unspecified: Secondary | ICD-10-CM | POA: Diagnosis present

## 2021-05-23 DIAGNOSIS — E441 Mild protein-calorie malnutrition: Secondary | ICD-10-CM | POA: Diagnosis present

## 2021-05-23 DIAGNOSIS — D849 Immunodeficiency, unspecified: Secondary | ICD-10-CM | POA: Diagnosis present

## 2021-05-23 DIAGNOSIS — I10 Essential (primary) hypertension: Secondary | ICD-10-CM | POA: Diagnosis present

## 2021-05-23 DIAGNOSIS — R0609 Other forms of dyspnea: Secondary | ICD-10-CM | POA: Diagnosis not present

## 2021-05-23 LAB — CBC WITH DIFFERENTIAL/PLATELET
Abs Immature Granulocytes: 0.05 10*3/uL (ref 0.00–0.07)
Basophils Absolute: 0.1 10*3/uL (ref 0.0–0.1)
Basophils Relative: 1 %
Eosinophils Absolute: 0.1 10*3/uL (ref 0.0–0.5)
Eosinophils Relative: 1 %
HCT: 43.8 % (ref 36.0–46.0)
Hemoglobin: 15.2 g/dL — ABNORMAL HIGH (ref 12.0–15.0)
Immature Granulocytes: 1 %
Lymphocytes Relative: 30 %
Lymphs Abs: 2.9 10*3/uL (ref 0.7–4.0)
MCH: 34.9 pg — ABNORMAL HIGH (ref 26.0–34.0)
MCHC: 34.7 g/dL (ref 30.0–36.0)
MCV: 100.5 fL — ABNORMAL HIGH (ref 80.0–100.0)
Monocytes Absolute: 0.7 10*3/uL (ref 0.1–1.0)
Monocytes Relative: 7 %
Neutro Abs: 6 10*3/uL (ref 1.7–7.7)
Neutrophils Relative %: 60 %
Platelets: 210 10*3/uL (ref 150–400)
RBC: 4.36 MIL/uL (ref 3.87–5.11)
RDW: 13.2 % (ref 11.5–15.5)
WBC: 9.8 10*3/uL (ref 4.0–10.5)
nRBC: 0 % (ref 0.0–0.2)

## 2021-05-23 NOTE — ED Provider Notes (Signed)
[facility] EMERGENCY DEPARTMENT Provider Note   CSN: [id number] Arrival date & time: 05/23/21  2258     History Chief Complaint  Patient presents with   Respiratory Distress   Level 5 caveat due to respiratory distress Emily Bishop is a 65 y.o. female.  The history is provided by the patient.  Shortness of Breath Severity:  Severe Onset quality:  Sudden Timing:  Constant Progression:  Worsening Chronicity:  New Worsened by:  Nothing Patient presents with shortness of breath.  Reports has been having problems for several months, but became worse tonight.  Reports cough, shortness of breath and wheezing.  No chest pain.  No hemoptysis. She was brought by EMS and placed on CPAP and given nebulized therapies, magnesium and Solu-Medrol.    Past Medical History:  Diagnosis Date   History of blood transfusion    Hypercholesteremia    Hypertension     Patient Active Problem List   Diagnosis Date Noted   Ankle fracture 02/07/2018    Past Surgical History:  Procedure Laterality Date   EXTERNAL FIXATION REMOVAL Left 03/20/2018   Procedure: REMOVAL EXTERNAL FIXATION  LEFT LEG;  Surgeon: Myrene Galas, MD;  Location: [facility] OR;  Service: Orthopedics;  Laterality: Left;   EYE SURGERY Left    retina occulsion   ORIF ANKLE FRACTURE Left 02/08/2018   Procedure: external fixation left ankle;  Surgeon: Signa Kell, MD;  Location: [facility] ORS;  Service: Orthopedics;  Laterality: Left;     OB History   No obstetric history on file.     No family history on file.  Social History   Tobacco Use   Smoking status: Every Day    Packs/day: 0.50    Years: 20.00    Pack years: 10.00    Types: Cigarettes   Smokeless tobacco: Never  Vaping Use   Vaping Use: Former  Substance Use Topics   Alcohol use: Never   Drug use: Never    Home Medications Prior to Admission medications   Medication Sig Start Date End Date Taking? Authorizing Provider  albuterol  (PROVENTIL HFA;VENTOLIN HFA) 108 (90 Base) MCG/ACT inhaler Inhale 2 puffs into the lungs See admin instructions. Inhale 2 puff in to the lungs in the morning. Inhale 2 puffs in to the lungs every 4 hours as needed for shortness of breath or wheezing. 02/04/18   [provider]  aspirin EC 325 MG EC tablet Take 1 tablet (325 mg total) by mouth 2 (two) times daily. Patient taking differently: Take 325 mg by mouth daily.  02/09/18   Dedra Skeens, PA-C  atorvastatin (LIPITOR) 10 MG tablet Take 10 mg by mouth daily. 01/28/18   [provider]  Cholecalciferol (VITAMIN D3) 5000 units CAPS Take 5,000 Units by mouth daily.    [provider]  fluticasone (FLONASE) 50 MCG/ACT nasal spray Place 2 sprays into both nostrils daily. 01/20/18   [provider]  losartan (COZAAR) 50 MG tablet Take 50 mg by mouth daily. 02/01/18   [provider]  ondansetron (ZOFRAN) 4 MG tablet Take 1 tablet (4 mg total) by mouth every 6 (six) hours as needed for nausea. 03/20/18   Montez Morita, PA-C  oxyCODONE-acetaminophen (PERCOCET/ROXICET) 5-325 MG tablet Take 1-2 tablets by mouth every 8 (eight) hours as needed for severe pain. 03/20/18   Montez Morita, PA-C  SPIRIVA HANDIHALER 18 MCG inhalation capsule Place 18 mcg into inhaler and inhale daily.  02/07/18   [provider]  Allergies    Penicillins  Review of Systems   Review of Systems  Unable to perform ROS: Severe respiratory distress  Respiratory:  Positive for shortness of breath.    Physical Exam Updated Vital Signs BP (!) 161/97   Pulse 96   Temp 98 F (36.7 C) (Axillary)   Resp (!) 21   Ht 1.676 m (5\' 6" )   Wt 52.2 kg   SpO2 100%   BMI 18.56 kg/m   Physical Exam CONSTITUTIONAL: Ill-appearing, frail HEAD: Normocephalic/atraumatic EYES: EOMI/PERRL ENMT: BiPAP mask in place NECK: supple no meningeal signs SPINE/BACK:entire spine nontender CV: S1/S2 noted, no murmurs/rubs/gallops noted LUNGS: Tachypnea,  decreased breath sounds on ABDOMEN: soft, nontender, no rebound or guarding, bowel sounds noted throughout abdomen GU:no cva tenderness NEURO: Pt is awake/alert/appropriate, moves all extremitiesx4.  No facial droop.   EXTREMITIES: pulses normal/equalx4, full ROM, no lower extremity edema SKIN: warm, color normal PSYCH: Unable to assess ED Results / Procedures / Treatments   Labs (all labs ordered are listed, but only abnormal results are displayed) Labs Reviewed  BASIC METABOLIC PANEL - Abnormal; Notable for the following components:      Result Value   Sodium 134 (*)    Chloride 97 (*)    Glucose, Bld 100 (*)    BUN 5 (*)    All other components within normal limits  CBC WITH DIFFERENTIAL/PLATELET - Abnormal; Notable for the following components:   Hemoglobin 15.2 (*)    MCV 100.5 (*)    MCH 34.9 (*)    All other components within normal limits  RESP PANEL BY RT-PCR (FLU A&B, COVID) [id number]    EKG EKG Interpretation  Date/Time:  Wednesday May 23 2021 23:06:32 EDT Ventricular Rate:  98 PR Interval:  135 QRS Duration: 92 QT Interval:  334 QTC Calculation: 427 R Axis:   83 Text Interpretation: Sinus rhythm Borderline right axis deviation Low voltage, precordial leads Interpretation limited secondary to artifact Confirmed by 03-26-1989 (Zadie Rhine) on 05/23/2021 11:22:53 PM  Radiology DG Chest Port 1 View  Result Date: 05/23/2021 CLINICAL DATA:  Shortness of breath EXAM: PORTABLE CHEST 1 VIEW COMPARISON:  03/08/2021 FINDINGS: Hyperinflation with emphysema. No focal opacity or pleural effusion. Normal cardiac size. Aortic atherosclerosis. No pneumothorax. IMPRESSION: Hyperinflation with emphysema. No acute superimposed airspace disease Electronically Signed   By: 05/08/2021 M.D.   On: 05/23/2021 23:32    Procedures .Critical Care Performed by: 05/25/2021, MD Authorized by: Zadie Rhine, MD   Critical care provider statement:    Critical care time  (minutes):  43   Critical care start time:  05/23/2021 11:47 PM   Critical care end time:  05/24/2021 12:30 AM   Critical care time was exclusive of:  Separately billable procedures and treating other patients   Critical care was necessary to treat or prevent imminent or life-threatening deterioration of the following conditions:  Respiratory failure   Critical care was time spent personally by me on the following activities:  Development of treatment plan with patient or surrogate, discussions with consultants, examination of patient, re-evaluation of patient's condition, pulse oximetry, ordering and review of radiographic studies, ordering and review of laboratory studies, ordering and performing treatments and interventions and evaluation of patient's response to treatment   I assumed direction of critical care for this patient from another provider in my specialty: no     Medications Ordered in ED Medications  albuterol (PROVENTIL) (2.5 MG/3ML) 0.083% nebulizer solution (10 mg/hr Nebulization Given 05/24/21 0313)  ED Course  I have reviewed the triage vital signs and the nursing notes.  Pertinent labs & imaging results that were available during my care of the patient were reviewed by me and considered in my medical decision making (see chart for details).    MDM Rules/Calculators/A&P                           Patient was brought in by EMS for respiratory distress.  She was placed on noninvasive ventilation with nebulized therapies and appears to be improving Will continue on BiPAP for now and then reassess 1:38 AM Patient is now off of BiPAP and appears to be improving.  Patient reports she has been having difficulty breathing for several months and has been seen by her primary care provider but has not been seen by pulmonology.  She did undergo CT chest for lung cancer screening back in September that revealed nodules that need to be monitored. Patient reports weight loss and  dyspnea on exertion on a daily basis She is now on room air.  Plan will be to keep on room air and reassess. 3:37 AM Patient overall felt improved.  However after ambulating to the bathroom she became tachypneic and had difficulty speaking.  She had decreased breath sounds bilaterally with some wheezing. However patient was not hypoxic. Patient may not be getting appropriate outpatient management for severe COPD, and she is still smoking Given patient's worsening condition, and the severity of illness prior to arrival, I advised admission. Continuous nebulizer has been ordered. Discussed  with Dr. Loney Loh for admission Final Clinical Impression(s) / ED Diagnoses Final diagnoses:  COPD exacerbation [facility])    Rx / DC Orders ED Discharge Orders     None        Zadie Rhine, MD 05/24/21 [phone]

## 2021-05-23 NOTE — ED Triage Notes (Signed)
Pt bib [facility] for respiratory distress. Pt originally called out for "feeling sick" and was in tripod position with SOB upon EMS arrival. Hx of COPD. Pt arrives on CPAP. 10 of albuterol, .5mg  atrovent, 2g mag, 125mg  solumedrol given PTA.

## 2021-05-24 ENCOUNTER — Encounter (HOSPITAL_COMMUNITY): Payer: Self-pay | Admitting: Internal Medicine

## 2021-05-24 DIAGNOSIS — I161 Hypertensive emergency: Secondary | ICD-10-CM | POA: Diagnosis not present

## 2021-05-24 DIAGNOSIS — J9601 Acute respiratory failure with hypoxia: Secondary | ICD-10-CM | POA: Diagnosis not present

## 2021-05-24 DIAGNOSIS — J441 Chronic obstructive pulmonary disease with (acute) exacerbation: Secondary | ICD-10-CM | POA: Diagnosis not present

## 2021-05-24 DIAGNOSIS — R634 Abnormal weight loss: Secondary | ICD-10-CM | POA: Diagnosis present

## 2021-05-24 DIAGNOSIS — J449 Chronic obstructive pulmonary disease, unspecified: Secondary | ICD-10-CM | POA: Diagnosis present

## 2021-05-24 DIAGNOSIS — B370 Candidal stomatitis: Secondary | ICD-10-CM | POA: Diagnosis present

## 2021-05-24 DIAGNOSIS — F1010 Alcohol abuse, uncomplicated: Secondary | ICD-10-CM

## 2021-05-24 HISTORY — DX: Chronic obstructive pulmonary disease with (acute) exacerbation: J44.1

## 2021-05-24 LAB — HEPATIC FUNCTION PANEL
ALT: 17 U/L (ref 0–44)
AST: 21 U/L (ref 15–41)
Albumin: 4.1 g/dL (ref 3.5–5.0)
Alkaline Phosphatase: 62 U/L (ref 38–126)
Bilirubin, Direct: 0.2 mg/dL (ref 0.0–0.2)
Indirect Bilirubin: 0.9 mg/dL (ref 0.3–0.9)
Total Bilirubin: 1.1 mg/dL (ref 0.3–1.2)
Total Protein: 6.6 g/dL (ref 6.5–8.1)

## 2021-05-24 LAB — BASIC METABOLIC PANEL
Anion gap: 11 (ref 5–15)
BUN: 5 mg/dL — ABNORMAL LOW (ref 8–23)
CO2: 26 mmol/L (ref 22–32)
Calcium: 9.2 mg/dL (ref 8.9–10.3)
Chloride: 97 mmol/L — ABNORMAL LOW (ref 98–111)
Creatinine, Ser: 0.63 mg/dL (ref 0.44–1.00)
GFR, Estimated: >60 mL/min (ref >60)
Glucose, Bld: 100 mg/dL — ABNORMAL HIGH (ref 70–99)
Potassium: 4.2 mmol/L (ref 3.5–5.1)
Sodium: 134 mmol/L — ABNORMAL LOW (ref 135–145)

## 2021-05-24 LAB — HIV ANTIBODY (ROUTINE TESTING W REFLEX): HIV Screen 4th Generation wRfx: NONREACTIVE

## 2021-05-24 LAB — RESP PANEL BY RT-PCR (FLU A&B, COVID) ARPGX2
Influenza A by PCR: NEGATIVE
Influenza B by PCR: NEGATIVE
SARS Coronavirus 2 by RT PCR: NEGATIVE

## 2021-05-24 LAB — TSH: TSH: 0.361 u[IU]/mL (ref 0.350–4.500)

## 2021-05-24 MED ORDER — LORAZEPAM 1 MG PO TABS: 1.0000 mg | ORAL_TABLET | ORAL | Status: DC | PRN

## 2021-05-24 MED ORDER — IPRATROPIUM-ALBUTEROL 0.5-2.5 (3) MG/3ML IN SOLN
3.0000 mL | Freq: Four times a day (QID) | RESPIRATORY_TRACT | Status: DC
  Administered 2021-05-24 – 2021-05-26 (×7): 3 mL via RESPIRATORY_TRACT
  Filled 2021-05-24 (×8): qty 3

## 2021-05-24 MED ORDER — LORAZEPAM 2 MG/ML IJ SOLN: 0.0000 mg | Freq: Two times a day (BID) | INTRAMUSCULAR | Status: DC

## 2021-05-24 MED ORDER — LORAZEPAM 2 MG/ML IJ SOLN
1.0000 mg | INTRAMUSCULAR | Status: DC | PRN
  Administered 2021-05-25: 2 mg via INTRAVENOUS
  Filled 2021-05-24 (×2): qty 1

## 2021-05-24 MED ORDER — GUAIFENESIN ER 600 MG PO TB12
600.0000 mg | ORAL_TABLET | Freq: Two times a day (BID) | ORAL | Status: DC
  Administered 2021-05-24 – 2021-05-26 (×5): 600 mg via ORAL
  Filled 2021-05-24 (×5): qty 1

## 2021-05-24 MED ORDER — AZITHROMYCIN 250 MG PO TABS
500.0000 mg | ORAL_TABLET | Freq: Every day | ORAL | Status: DC
  Administered 2021-05-24 – 2021-05-26 (×3): 500 mg via ORAL
  Filled 2021-05-24 (×4): qty 2

## 2021-05-24 MED ORDER — SODIUM CHLORIDE 0.9% FLUSH
3.0000 mL | Freq: Two times a day (BID) | INTRAVENOUS | Status: DC
  Administered 2021-05-24 – 2021-05-26 (×6): 3 mL via INTRAVENOUS

## 2021-05-24 MED ORDER — NICOTINE 14 MG/24HR TD PT24: 14.0000 mg | MEDICATED_PATCH | Freq: Every day | TRANSDERMAL | Status: DC | PRN

## 2021-05-24 MED ORDER — THIAMINE HCL 100 MG/ML IJ SOLN
100.0000 mg | Freq: Every day | INTRAMUSCULAR | Status: DC
  Administered 2021-05-25: 100 mg via INTRAVENOUS
  Filled 2021-05-24: qty 2

## 2021-05-24 MED ORDER — ONDANSETRON HCL 4 MG/2ML IJ SOLN: 4.0000 mg | Freq: Four times a day (QID) | INTRAMUSCULAR | Status: DC | PRN

## 2021-05-24 MED ORDER — ENOXAPARIN SODIUM 40 MG/0.4ML IJ SOSY
40.0000 mg | PREFILLED_SYRINGE | INTRAMUSCULAR | Status: DC
  Administered 2021-05-24 – 2021-05-25 (×2): 40 mg via SUBCUTANEOUS
  Filled 2021-05-24 (×2): qty 0.4

## 2021-05-24 MED ORDER — HYDRALAZINE HCL 20 MG/ML IJ SOLN
10.0000 mg | INTRAMUSCULAR | Status: DC | PRN
  Administered 2021-05-24: 10 mg via INTRAVENOUS
  Filled 2021-05-24: qty 1

## 2021-05-24 MED ORDER — ACETAMINOPHEN 325 MG PO TABS
650.0000 mg | ORAL_TABLET | Freq: Once | ORAL | Status: AC | PRN
  Administered 2021-05-24: 650 mg via ORAL
  Filled 2021-05-24: qty 2

## 2021-05-24 MED ORDER — ACETAMINOPHEN 325 MG PO TABS
650.0000 mg | ORAL_TABLET | Freq: Four times a day (QID) | ORAL | Status: DC | PRN
  Administered 2021-05-25 – 2021-05-26 (×2): 650 mg via ORAL
  Filled 2021-05-24 (×2): qty 2

## 2021-05-24 MED ORDER — FOLIC ACID 1 MG PO TABS
1.0000 mg | ORAL_TABLET | Freq: Every day | ORAL | Status: DC
  Administered 2021-05-24 – 2021-05-26 (×3): 1 mg via ORAL
  Filled 2021-05-24 (×3): qty 1

## 2021-05-24 MED ORDER — ATORVASTATIN CALCIUM 10 MG PO TABS
10.0000 mg | ORAL_TABLET | Freq: Every morning | ORAL | Status: DC
  Administered 2021-05-24 – 2021-05-26 (×3): 10 mg via ORAL
  Filled 2021-05-24 (×3): qty 1

## 2021-05-24 MED ORDER — BUSPIRONE HCL 5 MG PO TABS
15.0000 mg | ORAL_TABLET | Freq: Every morning | ORAL | Status: DC
  Administered 2021-05-24 – 2021-05-26 (×3): 15 mg via ORAL
  Filled 2021-05-24: qty 2
  Filled 2021-05-24 (×2): qty 1

## 2021-05-24 MED ORDER — PREDNISONE 20 MG PO TABS
40.0000 mg | ORAL_TABLET | Freq: Every day | ORAL | Status: DC
  Administered 2021-05-26: 40 mg via ORAL
  Filled 2021-05-24 (×2): qty 2

## 2021-05-24 MED ORDER — NYSTATIN 100000 UNIT/ML MT SUSP
5.0000 mL | Freq: Four times a day (QID) | OROMUCOSAL | Status: DC
  Administered 2021-05-24 – 2021-05-26 (×7): 500000 [IU] via ORAL
  Filled 2021-05-24 (×9): qty 5

## 2021-05-24 MED ORDER — FLUTICASONE PROPIONATE 50 MCG/ACT NA SUSP
2.0000 | Freq: Every day | NASAL | Status: DC
  Administered 2021-05-25 – 2021-05-26 (×2): 2 via NASAL
  Filled 2021-05-24: qty 16

## 2021-05-24 MED ORDER — BUDESONIDE 0.5 MG/2ML IN SUSP
0.5000 mg | Freq: Two times a day (BID) | RESPIRATORY_TRACT | Status: DC
  Administered 2021-05-24 – 2021-05-26 (×5): 0.5 mg via RESPIRATORY_TRACT
  Filled 2021-05-24 (×7): qty 2

## 2021-05-24 MED ORDER — ALBUTEROL SULFATE (2.5 MG/3ML) 0.083% IN NEBU
10.0000 mg/h | INHALATION_SOLUTION | Freq: Once | RESPIRATORY_TRACT | Status: AC
  Administered 2021-05-24: 10 mg/h via RESPIRATORY_TRACT
  Filled 2021-05-24: qty 3

## 2021-05-24 MED ORDER — ALBUTEROL SULFATE (2.5 MG/3ML) 0.083% IN NEBU: 2.5000 mg | INHALATION_SOLUTION | Freq: Four times a day (QID) | RESPIRATORY_TRACT | Status: DC

## 2021-05-24 MED ORDER — TRAZODONE HCL 50 MG PO TABS
50.0000 mg | ORAL_TABLET | Freq: Every evening | ORAL | Status: DC | PRN
  Administered 2021-05-25: 50 mg via ORAL
  Filled 2021-05-24: qty 1

## 2021-05-24 MED ORDER — ALBUTEROL SULFATE (2.5 MG/3ML) 0.083% IN NEBU
2.5000 mg | INHALATION_SOLUTION | RESPIRATORY_TRACT | Status: DC | PRN
  Administered 2021-05-25 (×2): 2.5 mg via RESPIRATORY_TRACT
  Filled 2021-05-24 (×3): qty 3

## 2021-05-24 MED ORDER — SODIUM CHLORIDE 0.9 % IV SOLN
500.0000 mg | INTRAVENOUS | Status: DC
  Administered 2021-05-24: 500 mg via INTRAVENOUS
  Filled 2021-05-24: qty 500

## 2021-05-24 MED ORDER — ARFORMOTEROL TARTRATE 15 MCG/2ML IN NEBU
15.0000 ug | INHALATION_SOLUTION | Freq: Two times a day (BID) | RESPIRATORY_TRACT | Status: DC
  Administered 2021-05-24 – 2021-05-26 (×5): 15 ug via RESPIRATORY_TRACT
  Filled 2021-05-24 (×7): qty 2

## 2021-05-24 MED ORDER — THIAMINE HCL 100 MG PO TABS
100.0000 mg | ORAL_TABLET | Freq: Every day | ORAL | Status: DC
  Administered 2021-05-24 – 2021-05-26 (×2): 100 mg via ORAL
  Filled 2021-05-24 (×3): qty 1

## 2021-05-24 MED ORDER — ADULT MULTIVITAMIN W/MINERALS CH
1.0000 | ORAL_TABLET | Freq: Every day | ORAL | Status: DC
  Administered 2021-05-24 – 2021-05-26 (×3): 1 via ORAL
  Filled 2021-05-24 (×3): qty 1

## 2021-05-24 MED ORDER — ONDANSETRON HCL 4 MG PO TABS: 4.0000 mg | ORAL_TABLET | Freq: Four times a day (QID) | ORAL | Status: DC | PRN

## 2021-05-24 MED ORDER — METHYLPREDNISOLONE SODIUM SUCC 40 MG IJ SOLR
40.0000 mg | Freq: Two times a day (BID) | INTRAMUSCULAR | Status: AC
  Administered 2021-05-24 (×2): 40 mg via INTRAVENOUS
  Filled 2021-05-24 (×2): qty 1

## 2021-05-24 MED ORDER — LORAZEPAM 2 MG/ML IJ SOLN
0.0000 mg | Freq: Four times a day (QID) | INTRAMUSCULAR | Status: AC
  Administered 2021-05-24 – 2021-05-25 (×2): 2 mg via INTRAVENOUS
  Administered 2021-05-25: 1 mg via INTRAVENOUS
  Filled 2021-05-24 (×2): qty 1

## 2021-05-24 MED ORDER — ACETAMINOPHEN 650 MG RE SUPP: 650.0000 mg | Freq: Four times a day (QID) | RECTAL | Status: DC | PRN

## 2021-05-24 MED ORDER — LOSARTAN POTASSIUM 50 MG PO TABS
50.0000 mg | ORAL_TABLET | Freq: Every morning | ORAL | Status: DC
  Administered 2021-05-24 – 2021-05-26 (×3): 50 mg via ORAL
  Filled 2021-05-24 (×3): qty 1

## 2021-05-24 NOTE — H&P (Signed)
History and Physical    Emily Bishop WSF:[id number] DOB: 08-26-1955 DOA: 05/23/2021  Referring MD/NP/PA: John Giovanni, MD PCP: Ailene Ravel, MD  Patient coming from: Via EMS  Chief Complaint: Shortness of breath I have personally briefly reviewed patient's old medical records in [facility]   HPI: Emily Bishop is a 65 y.o. female with medical history significant of hypertension, COPD, tobacco abuse, and alcohol abuse who presents with complaints of shortness of breath which was acutely worse last night.  She has been having trouble breathing and has not felt well for the last 1-2 months.  Last night after she had eaten dinner she was laying on couch and doing her work on her laptop when her breathing worsen.  She reports that she has been using her inhalers 4-5 times a day on average due to her breathing.  Emily Bishop has had a cough that is intermittently productive with clear sputum and has been wheezing.  Denies any recent fever, sick contacts, nausea, vomiting , diarrhea, abdominal pain, or dysuria symptoms.  Admits to smoking half pack cigarettes per day on average and drinks anywhere from 4-5 beers per night on average.  Associated symptoms include sore throat, red marks rash on skin, weight loss for which she had lost 2 dress sizes over the last 3 months. In route with EMS patient was placed on CPAP due to respiratory distress, given DuoNeb breathing treatment, magnesium sulfate 2 g IV, and Solu-Medrol 125 mg IV.  Normally patient is not on oxygen at home.  She also reports that she had recently had a CT scan performed of her chest on 9/15 which noted numerous new solid pulmonary nodules measuring less than 6 mm for which there was concern of infection or aspiration and repeat CT scan of the chest recommended in 12 months..  ED Course: Upon admission to the emergency department patient was seen to be afebrile, pulse 90-114, respiration 15-37, blood pressures elevated up  to 183/105, and patient was initially transitioned to BiPAP on admission due to work of breathing.  Overnight patient was able to be transition nasal cannula oxygen at 2 L with O2 saturations maintained.  Labs from 10/19 significant for hemoglobin 15.2, sodium 134, and chloride 197.  Chest x-ray noted hyperinflation with emphysema without any acute airspace disease appreciated.  Influenza and COVID-19 screening was negative.  Review of Systems  Constitutional:  Positive for malaise/fatigue and weight loss. Negative for fever.  HENT:  Negative for nosebleeds.   Eyes:  Negative for pain.  Respiratory:  Positive for cough, sputum production, shortness of breath and wheezing.   Cardiovascular:  Negative for chest pain and leg swelling.  Gastrointestinal:  Negative for abdominal pain, blood in stool, nausea and vomiting.  Genitourinary:  Negative for dysuria and hematuria.  Musculoskeletal:  Negative for falls.  Skin:  Positive for rash. Negative for itching.  Neurological:  Negative for focal weakness and loss of consciousness.  Psychiatric/Behavioral:  Positive for substance abuse. Negative for memory loss.    Past Medical History:  Diagnosis Date   COPD exacerbation (HCC) 05/24/2021   History of blood transfusion    Hypercholesteremia    Hypertension     Past Surgical History:  Procedure Laterality Date   EXTERNAL FIXATION REMOVAL Left 03/20/2018   Procedure: REMOVAL EXTERNAL FIXATION  LEFT LEG;  Surgeon: Myrene Galas, MD;  Location: [facility] OR;  Service: Orthopedics;  Laterality: Left;   EYE SURGERY Left    retina occulsion  ORIF ANKLE FRACTURE Left 02/08/2018   Procedure: external fixation left ankle;  Surgeon: Signa Kell, MD;  Location: [facility] ORS;  Service: Orthopedics;  Laterality: Left;     reports that she has been smoking. She has a 10.00 pack-year smoking history. She has never used smokeless tobacco. She reports that she does not drink alcohol and does not use drugs.  Allergies   Allergen Reactions   Penicillins Swelling and Other (See Comments)    Has patient had a PCN reaction causing immediate rash, facial/tongue/throat swelling, SOB or lightheadedness with hypotension: Unknown Has patient had a PCN reaction causing severe rash involving mucus membranes or skin necrosis: Unknown Has patient had a PCN reaction that required hospitalization: No Has patient had a PCN reaction occurring within the last 10 years: No If all of the above answers are "NO", then may proceed with Cephalosporin use.     No family history on file.  Prior to Admission medications   Medication Sig Start Date End Date Taking? Authorizing Provider  albuterol (PROVENTIL HFA;VENTOLIN HFA) 108 (90 Base) MCG/ACT inhaler Inhale 2 puffs into the lungs See admin instructions. Inhale 2 puff in to the lungs in the morning. Inhale 2 puffs in to the lungs every 4 hours as needed for shortness of breath or wheezing. 02/04/18   [provider]  aspirin EC 325 MG EC tablet Take 1 tablet (325 mg total) by mouth 2 (two) times daily. Patient taking differently: Take 325 mg by mouth daily.  02/09/18   Dedra Skeens, PA-C  atorvastatin (LIPITOR) 10 MG tablet Take 10 mg by mouth daily. 01/28/18   [provider]  Cholecalciferol (VITAMIN D3) 5000 units CAPS Take 5,000 Units by mouth daily.    [provider]  fluticasone (FLONASE) 50 MCG/ACT nasal spray Place 2 sprays into both nostrils daily. 01/20/18   [provider]  losartan (COZAAR) 50 MG tablet Take 50 mg by mouth daily. 02/01/18   [provider]  ondansetron (ZOFRAN) 4 MG tablet Take 1 tablet (4 mg total) by mouth every 6 (six) hours as needed for nausea. 03/20/18   Montez Morita, PA-C  oxyCODONE-acetaminophen (PERCOCET/ROXICET) 5-325 MG tablet Take 1-2 tablets by mouth every 8 (eight) hours as needed for severe pain. 03/20/18   Montez Morita, PA-C  SPIRIVA HANDIHALER 18 MCG inhalation capsule Place 18 mcg into inhaler and  inhale daily.  02/07/18   [provider]    Physical Exam:  Constitutional: Older female who appears to be in some respiratory distress Vitals:   05/24/21 0730 05/24/21 0830 05/24/21 0900 05/24/21 1000  BP: (!) 167/92 (!) 167/98 (!) 163/96 (!) 178/96  Pulse: 86 89 81 92  Resp: 20 20 20 19   Temp:      TempSrc:      SpO2: 100% 100% 100% 100%  Weight:      Height:       Eyes: PERRL, lids and conjunctivae normal ENMT: Mucous membranes are dry.  White plaques noted on the posterior oropharynx. Neck: normal, supple, no masses, no thyromegaly Respiratory: Decreased aeration with wheezing appreciated in both lungs.  Patient currently on 2 L nasal cannula oxygen with O2 saturations maintained. Cardiovascular: Tachycardic, no murmurs / rubs / gallops. No extremity edema. 2+ pedal pulses. No carotid bruits.  Abdomen: no tenderness, no masses palpated. No hepatosplenomegaly. Bowel sounds positive.  Musculoskeletal: no clubbing / cyanosis. No joint deformity upper and lower extremities. Good ROM, no contractures.  Skin: Spider angiomas noted upper extremities. Neurologic: CN  2-12 grossly intact. Sensation intact, DTR normal. Strength 5/5 in all 4.  Psychiatric: Normal judgment and insight. Alert and oriented x 3.  Anxious l mood.     Labs on Admission: I have personally reviewed following labs and imaging studies  CBC: Recent Labs  Lab 05/23/21 2305  WBC 9.8  NEUTROABS 6.0  HGB 15.2*  HCT 43.8  MCV 100.5*  PLT 210   Basic Metabolic Panel: Recent Labs  Lab 05/23/21 2305  NA 134*  K 4.2  CL 97*  CO2 26  GLUCOSE 100*  BUN 5*  CREATININE 0.63  CALCIUM 9.2   GFR: Estimated Creatinine Clearance: 60.1 mL/min (by C-G formula based on SCr of 0.63 mg/dL). Liver Function Tests: No results for input(s): AST, ALT, ALKPHOS, BILITOT, PROT, ALBUMIN in the last 168 hours. No results for input(s): LIPASE, AMYLASE in the last 168 hours. No results for input(s): AMMONIA in the  last 168 hours. Coagulation Profile: No results for input(s): INR, PROTIME in the last 168 hours. Cardiac Enzymes: No results for input(s): CKTOTAL, CKMB, CKMBINDEX, TROPONINI in the last 168 hours. BNP (last 3 results) No results for input(s): PROBNP in the last 8760 hours. HbA1C: No results for input(s): HGBA1C in the last 72 hours. CBG: No results for input(s): GLUCAP in the last 168 hours. Lipid Profile: No results for input(s): CHOL, HDL, LDLCALC, TRIG, CHOLHDL, LDLDIRECT in the last 72 hours. Thyroid Function Tests: No results for input(s): TSH, T4TOTAL, FREET4, T3FREE, THYROIDAB in the last 72 hours. Anemia Panel: No results for input(s): VITAMINB12, FOLATE, FERRITIN, TIBC, IRON, RETICCTPCT in the last 72 hours. Urine analysis: No results found for: COLORURINE, APPEARANCEUR, LABSPEC, PHURINE, GLUCOSEU, HGBUR, BILIRUBINUR, KETONESUR, PROTEINUR, UROBILINOGEN, NITRITE, LEUKOCYTESUR Sepsis Labs: Recent Results (from the past 240 hour(s))  Resp Panel by RT-PCR (Flu A&B, Covid) Nasopharyngeal Swab     Status: None   Collection Time: 05/23/21 11:38 PM   Specimen: Nasopharyngeal Swab; Nasopharyngeal(NP) swabs in vial transport medium  Result Value Ref Range Status   SARS Coronavirus 2 by RT PCR NEGATIVE NEGATIVE Final    Comment: (NOTE) SARS-CoV-2 target nucleic acids are NOT DETECTED.  The SARS-CoV-2 RNA is generally detectable in upper respiratory specimens during the acute phase of infection. The lowest concentration of SARS-CoV-2 viral copies this assay can detect is 138 copies/mL. A negative result does not preclude SARS-Cov-2 infection and should not be used as the sole basis for treatment or other patient management decisions. A negative result may occur with  improper specimen collection/handling, submission of specimen other than nasopharyngeal swab, presence of viral mutation(s) within the areas targeted by this assay, and inadequate number of viral copies(<138  copies/mL). A negative result must be combined with clinical observations, patient history, and epidemiological information. The expected result is Negative.  Fact Sheet for Patients:  BloggerCourse.com  Fact Sheet for Healthcare Providers:  SeriousBroker.it  This test is no t yet approved or cleared by the [place] FDA and  has been authorized for detection and/or diagnosis of SARS-CoV-2 by FDA under an Emergency Use Authorization (EUA). This EUA will remain  in effect (meaning this test can be used) for the duration of the COVID-19 declaration under Section 564(b)(1) of the Act, 21 U.S.C.section 360bbb-3(b)(1), unless the authorization is terminated  or revoked sooner.       Influenza A by PCR NEGATIVE NEGATIVE Final   Influenza B by PCR NEGATIVE NEGATIVE Final    Comment: (NOTE) The Xpert Xpress SARS-CoV-2/FLU/RSV plus assay is intended as an  aid in the diagnosis of influenza from Nasopharyngeal swab specimens and should not be used as a sole basis for treatment. Nasal washings and aspirates are unacceptable for Xpert Xpress SARS-CoV-2/FLU/RSV testing.  Fact Sheet for Patients: BloggerCourse.com  Fact Sheet for Healthcare Providers: SeriousBroker.it  This test is not yet approved or cleared by the [place] FDA and has been authorized for detection and/or diagnosis of SARS-CoV-2 by FDA under an Emergency Use Authorization (EUA). This EUA will remain in effect (meaning this test can be used) for the duration of the COVID-19 declaration under Section 564(b)(1) of the Act, 21 U.S.C. section 360bbb-3(b)(1), unless the authorization is terminated or revoked.  Performed at [facility], [address] [place], [place] [address]      Radiological Exams on Admission: DG Chest Port 1 View  Result Date: 05/23/2021 CLINICAL DATA:  Shortness of breath EXAM:  PORTABLE CHEST 1 VIEW COMPARISON:  03/08/2021 FINDINGS: Hyperinflation with emphysema. No focal opacity or pleural effusion. Normal cardiac size. Aortic atherosclerosis. No pneumothorax. IMPRESSION: Hyperinflation with emphysema. No acute superimposed airspace disease Electronically Signed   By: Jasmine Pang M.D.   On: 05/23/2021 23:32    EKG: Independently reviewed.  Sinus rhythm at 98 bpm  Assessment/Plan  Acute respiratory failure secondary to COPD exacerbation: Patient presents with complaints of acutely worsening shortness of breath with reports of intermittently productive cough and wheezing.  Noted to have increased work of breathing for which patient had initially been placed on CPAP with EMS and transition to BiPAP.  At this time O2 saturations currently maintained on 2 L of nasal cannula oxygen.  Noted to have wheezing on physical exam.  Chest x-ray noting hyperinflation of the lungs with emphysema.  Patient had been given Solu-Medrol 125 mg IV, magnesium sulfate 2 g IV, and multiple breathing treatments in the ED. -Admit to a telemetry bed -Continuous pulse oximetry with nasal cannula oxygen maintain O2 saturation greater than 92% -DuoNebs 4 times daily with albuterol as needed -Pharmacy substitution for Brovana and budesonide nebs for Brezrtri inhaler -Azithromycin 500 mg daily -PT to eval and treat  Hypertensive urgency: Acute.  Blood pressures noted to be elevated up to 188/116.  Home blood pressure medications include losartan 50 mg daily.  Suspect elevated blood pressure likely multifactorial in nature given possibility of alcohol withdrawal. -Resume losartan -Hydralazine IV as needed elevated blood pressures greater than 180  Weight loss: Patient reports weight loss of 2 dress sizes over the last 3 months. -Check TSH and  HIV -May warrant further work-up in the outpatient setting  Thrush: Acute.  Possibly secondary to not effectively swish and swallow with use of inhalers  versus possibility of patient being immunocompromised. -Nystatin swish and swallow  Anxiety: Patient was noted to be anxious on physical exam.  Suspect this is in part related to alcohol withdrawals. -Continue buspirone  Pulmonary nodules: Noted during last CT scan of the chest from 04/19/2021. -Previously advised to repeat CT imaging in 1 year as annual screening  Alcohol abuse: Patient reports drinking 4-5 beers per day on average.  Last drink was yesterday evening. -CIWA protocols with scheduled Ativan -Check LFTs -May warrant further imaging  Tobacco abuse: Patient reports smoking half pack cigarettes per day on average.  Previously documented to have approximately 35 smoking pack year history by PCP.  DVT prophylaxis: Lovenox Code Status: Full Disposition Plan: Discharge home in 2 to 3 days Consults called: None Admission status:Observation  Clydie Braun MD [facility]  If 7PM-7AM, please contact night-coverage   05/24/2021, 10:25 AM

## 2021-05-24 NOTE — ED Notes (Signed)
Breakfast tray ordered 

## 2021-05-24 NOTE — ED Notes (Signed)
Pt complained of increased tenderness at IV site about 20 minutes after antibiotic was started. RN stopped infusion, flushed line, and notified MD

## 2021-05-24 NOTE — Progress Notes (Signed)
RT NOTE:  Pt taken off BIPAP and switched to [facility] per MD request. Pt tolerating well at this time. SpO2 100%, RR 27.

## 2021-05-24 NOTE — ED Notes (Signed)
Pt ambulated in hallway with o2 saturation remaining 96%-99%

## 2021-05-25 ENCOUNTER — Observation Stay (HOSPITAL_COMMUNITY): Payer: BC Managed Care – PPO

## 2021-05-25 DIAGNOSIS — I161 Hypertensive emergency: Secondary | ICD-10-CM | POA: Diagnosis not present

## 2021-05-25 DIAGNOSIS — R0609 Other forms of dyspnea: Secondary | ICD-10-CM | POA: Diagnosis not present

## 2021-05-25 DIAGNOSIS — I10 Essential (primary) hypertension: Secondary | ICD-10-CM | POA: Diagnosis present

## 2021-05-25 DIAGNOSIS — E7800 Pure hypercholesterolemia, unspecified: Secondary | ICD-10-CM | POA: Diagnosis present

## 2021-05-25 DIAGNOSIS — D849 Immunodeficiency, unspecified: Secondary | ICD-10-CM | POA: Diagnosis present

## 2021-05-25 DIAGNOSIS — Z7982 Long term (current) use of aspirin: Secondary | ICD-10-CM | POA: Diagnosis not present

## 2021-05-25 DIAGNOSIS — J441 Chronic obstructive pulmonary disease with (acute) exacerbation: Secondary | ICD-10-CM

## 2021-05-25 DIAGNOSIS — Z7951 Long term (current) use of inhaled steroids: Secondary | ICD-10-CM | POA: Diagnosis not present

## 2021-05-25 DIAGNOSIS — R634 Abnormal weight loss: Secondary | ICD-10-CM | POA: Diagnosis not present

## 2021-05-25 DIAGNOSIS — J9601 Acute respiratory failure with hypoxia: Secondary | ICD-10-CM | POA: Diagnosis present

## 2021-05-25 DIAGNOSIS — F1010 Alcohol abuse, uncomplicated: Secondary | ICD-10-CM | POA: Diagnosis present

## 2021-05-25 DIAGNOSIS — F419 Anxiety disorder, unspecified: Secondary | ICD-10-CM | POA: Diagnosis present

## 2021-05-25 DIAGNOSIS — R64 Cachexia: Secondary | ICD-10-CM | POA: Diagnosis present

## 2021-05-25 DIAGNOSIS — F17210 Nicotine dependence, cigarettes, uncomplicated: Secondary | ICD-10-CM | POA: Diagnosis present

## 2021-05-25 DIAGNOSIS — E441 Mild protein-calorie malnutrition: Secondary | ICD-10-CM | POA: Diagnosis present

## 2021-05-25 DIAGNOSIS — Z79899 Other long term (current) drug therapy: Secondary | ICD-10-CM | POA: Diagnosis not present

## 2021-05-25 DIAGNOSIS — B370 Candidal stomatitis: Secondary | ICD-10-CM | POA: Diagnosis present

## 2021-05-25 DIAGNOSIS — I160 Hypertensive urgency: Secondary | ICD-10-CM | POA: Diagnosis present

## 2021-05-25 DIAGNOSIS — Z681 Body mass index (BMI) 19 or less, adult: Secondary | ICD-10-CM | POA: Diagnosis not present

## 2021-05-25 DIAGNOSIS — Z880 Allergy status to penicillin: Secondary | ICD-10-CM | POA: Diagnosis not present

## 2021-05-25 DIAGNOSIS — Z20822 Contact with and (suspected) exposure to covid-19: Secondary | ICD-10-CM | POA: Diagnosis present

## 2021-05-25 LAB — CBC
HCT: 40.1 % (ref 36.0–46.0)
Hemoglobin: 14.1 g/dL (ref 12.0–15.0)
MCH: 34.6 pg — ABNORMAL HIGH (ref 26.0–34.0)
MCHC: 35.2 g/dL (ref 30.0–36.0)
MCV: 98.5 fL (ref 80.0–100.0)
Platelets: 193 10*3/uL (ref 150–400)
RBC: 4.07 MIL/uL (ref 3.87–5.11)
RDW: 13.2 % (ref 11.5–15.5)
WBC: 6 10*3/uL (ref 4.0–10.5)
nRBC: 0 % (ref 0.0–0.2)

## 2021-05-25 LAB — BASIC METABOLIC PANEL
Anion gap: 8 (ref 5–15)
BUN: 12 mg/dL (ref 8–23)
CO2: 28 mmol/L (ref 22–32)
Calcium: 9.3 mg/dL (ref 8.9–10.3)
Chloride: 97 mmol/L — ABNORMAL LOW (ref 98–111)
Creatinine, Ser: 0.6 mg/dL (ref 0.44–1.00)
GFR, Estimated: >60 mL/min (ref >60)
Glucose, Bld: 148 mg/dL — ABNORMAL HIGH (ref 70–99)
Potassium: 4.3 mmol/L (ref 3.5–5.1)
Sodium: 133 mmol/L — ABNORMAL LOW (ref 135–145)

## 2021-05-25 LAB — ECHOCARDIOGRAM COMPLETE
Area-P 1/2: 7.22 cm2
Calc EF: 60.4 %
Height: 66 in
S' Lateral: 3.1 cm
Single Plane A2C EF: 58.4 %
Single Plane A4C EF: 64.3 %
Weight: 1694.4 oz

## 2021-05-25 LAB — PHOSPHORUS: Phosphorus: 3.8 mg/dL (ref 2.5–4.6)

## 2021-05-25 LAB — MAGNESIUM: Magnesium: 2.1 mg/dL (ref 1.7–2.4)

## 2021-05-25 LAB — BRAIN NATRIURETIC PEPTIDE: B Natriuretic Peptide: 54.8 pg/mL (ref 0.0–100.0)

## 2021-05-25 NOTE — Plan of Care (Signed)

## 2021-05-25 NOTE — Plan of Care (Signed)

## 2021-05-25 NOTE — Progress Notes (Signed)
[facility]                                                                              Patient Demographics  Emily Bishop, is a 65 y.o. female, DOB - 07/31/1956, JFH:[id number]  Admit date - 05/23/2021   Admitting Physician Clydie Braun, MD  Outpatient Primary MD for the patient is Hamrick, Durward Fortes, MD  Outpatient specialists:   LOS - 0  days   Medical records reviewed and are as summarized below:    Chief Complaint  Patient presents with   Respiratory Distress       Brief summary   Patient is a 65 year old female with hypertension, COPD tobacco abuse, alcohol abuse presented with severe shortness of breath acutely worsened.  Patient reported that she had been having trouble breathing and not feeling well over the last 1 to 2 months.  Night before admission, she was laying on the couch, doing her work on her laptop when her shortness of breath worsened.  She has been using her inhalers 4-5 times in a day on average due to her breathing.  Also reported intermittently productive cough with clear sputum and wheezing.  Patient smokes half pack per day on average and drinks anywhere from 4-5 beers per night.  Also reported sore throat, rash, weight loss in the last 3 months.  Patient was placed on CPAP due to respiratory distress by EMS, given DuoNeb, magnesium sulfate, Solu-Medrol.  At home she is not on any home O2. In ED, she was transitioned to BiPAP due to tachypnea and work of breathing.  Subsequently on improvement, transition to 2 L 2 L O2   Assessment & Plan    Acute respiratory failure with hypoxia secondary to severe COPD exacerbation -Currently on 2 L, does not feel close to baseline yet, diminished breath sounds with scattered wheezing -Chest x-ray with hyperinflation of the lungs with emphysema, received IV Solu-Medrol in ED -Continue prednisone, duo nebs, Zithromax, will need LABA/LAMA with prednisone taper upon discharge. -BNP 54.8 -will  obtain 2D echo to assess for any pulmonary hypertension/cor pulmonale, CHF -Will need home O2 evaluation -Counseled strongly on smoking cessation.  Hypertensive urgency -In ED, BP was elevated up to 188/116 likely due to #1 -Losartan resumed, BP stable  Weight loss/cachexia, underweight -Could be due to end-stage COPD.  TSH is 0.3 -CT of the chest 04/2021 had shown numerous new solid pulmonary nodules measuring less than 6 mm likely sequelae of infection or aspiration, recommended repeat CT in 12 months -Estimated body mass index is 17.09 kg/m as calculated from the following:   Height as of this encounter: 5\' 6"  (1.676 m).   Weight as of this encounter: 48 kg. -Nutrition consult  Pulmonary nodules -Noted on CT chest 04/2021, recommended outpatient repeat CT in 12 months  Alcohol abuse -Currently not in any acute withdrawals, continue CIWA protocol with Ativan, thiamine, folate -Counseled on alcohol cessation  Anxiety -Continue buspirone  Thrush -Continue nystatin swish and swallow    Code Status: Full CODE STATUS DVT Prophylaxis:  enoxaparin (LOVENOX) injection 40 mg Start: 05/24/21 1600   Level of Care: Level of care:  Telemetry Medical Family Communication: Discussed all imaging results, lab results, explained to the patient's husband at the bedside   Disposition Plan:     Status is: Observation  The patient will require care spanning > 2 midnights and should be moved to inpatient because: Hypoxia, on 2 L, not on home O2 at home, wheezing, not at baseline     Time Spent in minutes   35 minutes  Procedures:  None   Consultants:   None  Antimicrobials:   Anti-infectives (From admission, onward)    Start     Dose/Rate Route Frequency Ordered Stop   05/24/21 1500  azithromycin (ZITHROMAX) tablet 500 mg        500 mg Oral Daily 05/24/21 1454     05/24/21 1245  azithromycin (ZITHROMAX) 500 mg in sodium chloride 0.9 % 250 mL IVPB  Status:  Discontinued         500 mg 250 mL/hr over 60 Minutes Intravenous Every 24 hours 05/24/21 1230 05/24/21 1454          Medications  Scheduled Meds:  arformoterol  15 mcg Nebulization BID   atorvastatin  10 mg Oral q AM   azithromycin  500 mg Oral Daily   budesonide (PULMICORT) nebulizer solution  0.5 mg Nebulization BID   busPIRone  15 mg Oral q AM   enoxaparin (LOVENOX) injection  40 mg Subcutaneous Q24H   fluticasone  2 spray Each Nare Daily   folic acid  1 mg Oral Daily   guaiFENesin  600 mg Oral BID   ipratropium-albuterol  3 mL Nebulization QID   LORazepam  0-4 mg Intravenous Q6H   Followed by   Melene Muller ON 05/26/2021] LORazepam  0-4 mg Intravenous Q12H   losartan  50 mg Oral q AM   multivitamin with minerals  1 tablet Oral Daily   nystatin  5 mL Oral QID   predniSONE  40 mg Oral Q breakfast   sodium chloride flush  3 mL Intravenous Q12H   thiamine  100 mg Oral Daily   Or   thiamine  100 mg Intravenous Daily   Continuous Infusions: PRN Meds:.acetaminophen **OR** acetaminophen, albuterol, hydrALAZINE, LORazepam **OR** LORazepam, nicotine, ondansetron **OR** ondansetron (ZOFRAN) IV, traZODone      Subjective:   Emily Bishop was seen and examined today.  States shortness of breath is somewhat better from the time she was admitted when she had severe respiratory distress.  Wheezing is improving however not currently at baseline yet.  Patient denies dizziness, chest pain, abdominal pain, N/V/D/C, new weakness, numbess, tingling.  No fevers. Objective:   Vitals:   05/25/21 0352 05/25/21 0627 05/25/21 0736 05/25/21 0906  BP:  124/80  125/82  Pulse:  93  (!) 102  Resp:  18    Temp:  98.1 F (36.7 C)  (!) 97.5 F (36.4 C)  TempSrc:  Oral  Oral  SpO2:  99% 98% 100%  Weight: 48 kg     Height:        Intake/Output Summary (Last 24 hours) at 05/25/2021 1144 Last data filed at 05/25/2021 0347 Gross per 24 hour  Intake 580 ml  Output 650 ml  Net -70 ml     Wt Readings from Last 3  Encounters:  05/25/21 48 kg  03/20/18 65.8 kg  02/08/18 69.2 kg     Exam General: Alert and oriented x 3, NAD Cardiovascular: S1 S2 auscultated, RRR Respiratory: Diminished breath sounds throughout with scattered wheezing Gastrointestinal: Soft, nontender, nondistended, + bowel  sounds Ext: no pedal edema bilaterally Neuro: no new deficits Psych: Normal affect and demeanor, alert and oriented x3    Data Reviewed:  I have personally reviewed following labs and imaging studies  Micro Results Recent Results (from the past 240 hour(s))  Resp Panel by RT-PCR (Flu A&B, Covid) Nasopharyngeal Swab     Status: None   Collection Time: 05/23/21 11:38 PM   Specimen: Nasopharyngeal Swab; Nasopharyngeal(NP) swabs in vial transport medium  Result Value Ref Range Status   SARS Coronavirus 2 by RT PCR NEGATIVE NEGATIVE Final    Comment: (NOTE) SARS-CoV-2 target nucleic acids are NOT DETECTED.  The SARS-CoV-2 RNA is generally detectable in upper respiratory specimens during the acute phase of infection. The lowest concentration of SARS-CoV-2 viral copies this assay can detect is 138 copies/mL. A negative result does not preclude SARS-Cov-2 infection and should not be used as the sole basis for treatment or other patient management decisions. A negative result may occur with  improper specimen collection/handling, submission of specimen other than nasopharyngeal swab, presence of viral mutation(s) within the areas targeted by this assay, and inadequate number of viral copies(<138 copies/mL). A negative result must be combined with clinical observations, patient history, and epidemiological information. The expected result is Negative.  Fact Sheet for Patients:  BloggerCourse.com  Fact Sheet for Healthcare Providers:  SeriousBroker.it  This test is no t yet approved or cleared by the [place] FDA and  has been authorized for detection  and/or diagnosis of SARS-CoV-2 by FDA under an Emergency Use Authorization (EUA). This EUA will remain  in effect (meaning this test can be used) for the duration of the COVID-19 declaration under Section 564(b)(1) of the Act, 21 U.S.C.section 360bbb-3(b)(1), unless the authorization is terminated  or revoked sooner.       Influenza A by PCR NEGATIVE NEGATIVE Final   Influenza B by PCR NEGATIVE NEGATIVE Final    Comment: (NOTE) The Xpert Xpress SARS-CoV-2/FLU/RSV plus assay is intended as an aid in the diagnosis of influenza from Nasopharyngeal swab specimens and should not be used as a sole basis for treatment. Nasal washings and aspirates are unacceptable for Xpert Xpress SARS-CoV-2/FLU/RSV testing.  Fact Sheet for Patients: BloggerCourse.com  Fact Sheet for Healthcare Providers: SeriousBroker.it  This test is not yet approved or cleared by the [place] FDA and has been authorized for detection and/or diagnosis of SARS-CoV-2 by FDA under an Emergency Use Authorization (EUA). This EUA will remain in effect (meaning this test can be used) for the duration of the COVID-19 declaration under Section 564(b)(1) of the Act, 21 U.S.C. section 360bbb-3(b)(1), unless the authorization is terminated or revoked.  Performed at [facility], [address], [place], [place] [address]     Radiology Reports DG Chest [place] 1 View  Result Date: 05/23/2021 CLINICAL DATA:  Shortness of breath EXAM: PORTABLE CHEST 1 VIEW COMPARISON:  03/08/2021 FINDINGS: Hyperinflation with emphysema. No focal opacity or pleural effusion. Normal cardiac size. Aortic atherosclerosis. No pneumothorax. IMPRESSION: Hyperinflation with emphysema. No acute superimposed airspace disease Electronically Signed   By: Jasmine Pang M.D.   On: 05/23/2021 23:32    Lab Data:  CBC: Recent Labs  Lab 05/23/21 2305 05/25/21 0319  WBC 9.8 6.0  NEUTROABS 6.0  --    HGB 15.2* 14.1  HCT 43.8 40.1  MCV 100.5* 98.5  PLT 210 193   Basic Metabolic Panel: Recent Labs  Lab 05/23/21 2305 05/25/21 0319  NA 134* 133*  K 4.2 4.3  CL 97* 97*  CO2 26 28  GLUCOSE 100* 148*  BUN 5* 12  CREATININE 0.63 0.60  CALCIUM 9.2 9.3  MG  --  2.1  PHOS  --  3.8   GFR: Estimated Creatinine Clearance: 55.3 mL/min (by C-G formula based on SCr of 0.6 mg/dL). Liver Function Tests: Recent Labs  Lab 05/24/21 1746  AST 21  ALT 17  ALKPHOS 62  BILITOT 1.1  PROT 6.6  ALBUMIN 4.1   No results for input(s): LIPASE, AMYLASE in the last 168 hours. No results for input(s): AMMONIA in the last 168 hours. Coagulation Profile: No results for input(s): INR, PROTIME in the last 168 hours. Cardiac Enzymes: No results for input(s): CKTOTAL, CKMB, CKMBINDEX, TROPONINI in the last 168 hours. BNP (last 3 results) No results for input(s): PROBNP in the last 8760 hours. HbA1C: No results for input(s): HGBA1C in the last 72 hours. CBG: No results for input(s): GLUCAP in the last 168 hours. Lipid Profile: No results for input(s): CHOL, HDL, LDLCALC, TRIG, CHOLHDL, LDLDIRECT in the last 72 hours. Thyroid Function Tests: Recent Labs    05/24/21 1746  TSH 0.361   Anemia Panel: No results for input(s): VITAMINB12, FOLATE, FERRITIN, TIBC, IRON, RETICCTPCT in the last 72 hours. Urine analysis: No results found for: COLORURINE, APPEARANCEUR, LABSPEC, PHURINE, GLUCOSEU, HGBUR, BILIRUBINUR, KETONESUR, PROTEINUR, UROBILINOGEN, NITRITE, LEUKOCYTESUR   Tahji Iroquois M.D. [facility] 05/25/2021, 11:44 AM  Available via Epic secure chat 7am-7pm After 7 pm, please refer to night coverage provider listed on amion.

## 2021-05-25 NOTE — Evaluation (Signed)
Physical Therapy Evaluation Patient Details Name: Emily Bishop MRN: [id number] DOB: 1955-08-26 Today's Date: 05/25/2021  History of Present Illness  Pt is a 65 y.o. female who presented 05/23/21 with SOB. Chest x-ray noted hyperinflation with emphysema without any acute airspace disease appreciated. Pt admitted with COPD exacerbation. PMH: HTN, COPD, tobacco and alcohol abuse   Clinical Impression  Pt presents with condition above and deficits mentioned below, see PT Problem List. PTA, she was independent without AD, not on home O2, living with her husband in a 1-level house with a ramped entrance option. Pt displays deficits in lower extremity strength, mild deficits in balance (likely due to feeling lightheaded and SOB), and decreased activity tolerance. Pt was only able to ambulate up to ~60 ft distances without UE support with only supervision and no LOB before fatiguing with SOB and needing to sit to rest. However, her SpO2 levels remained >/= 94% on RA throughout the session. Educated pt on pursed lip breathing and encouraged pt to sit up OOB and mobilize often with husband or staff. Pt would benefit from further acute PT to maximize her return to baseline.      Recommendations for follow up therapy are one component of a multi-disciplinary discharge planning process, led by the attending physician.  Recommendations may be updated based on patient status, additional functional criteria and insurance authorization.  Follow Up Recommendations No PT follow up    Equipment Recommendations  None recommended by PT    Recommendations for Other Services       Precautions / Restrictions Precautions Precautions: Other (comment) Precaution Comments: gets SOB easily (maintains SpO2 >/= 94% RA) Restrictions Weight Bearing Restrictions: No      Mobility  Bed Mobility Overal bed mobility: Modified Independent             General bed mobility comments: Pt able to perform all bed  mobility aspects without assistance.    Transfers Overall transfer level: Needs assistance   Transfers: Sit to/from Stand Sit to Stand: Supervision         General transfer comment: Supervision for safety to come to stand, no LOB, mostly steady  Ambulation/Gait Ambulation/Gait assistance: Supervision Gait Distance (Feet): 60 Feet (x2 bouts of ~50 ft > ~60 ft) Assistive device: None Gait Pattern/deviations: Step-through pattern;Decreased stride length;Narrow base of support Gait velocity: reduced Gait velocity interpretation: 1.31 - 2.62 ft/sec, indicative of limited community ambulator General Gait Details: Pt with mostly steady gait, but intermittent narrow feet placement causing a mild trunk sway, supervision for safety, no LOB though. Pt with DOE 4/4 but SPO2 >/= 94% throughout, possibly a little anxious, causing pt to need to sit to rest.  Stairs            Wheelchair Mobility    Modified Rankin (Stroke Patients Only)       Balance Overall balance assessment: Mild deficits observed, not formally tested                                           Pertinent Vitals/Pain Pain Assessment: Faces Faces Pain Scale: No hurt Pain Intervention(s): Monitored during session    Home Living Family/patient expects to be discharged to:: Private residence Living Arrangements: Spouse/significant other Available Help at Discharge: Family Type of Home: House Home Access: Ramped entrance;Stairs to enter Entrance Stairs-Rails: Right Entrance Stairs-Number of Steps: 3 (ramp option though) Home Layout:  One level Home Equipment: Walker - 2 wheels;Bedside commode;Tub bench;Shower seat - built in;Grab bars - tub/shower;Wheelchair - manual      Prior Function Level of Independence: Independent         Comments: Pt works on [employer]. Pt drives. Denies any recent falls.     Hand Dominance   Dominant Hand: Right    Extremity/Trunk Assessment   Upper  Extremity Assessment Upper Extremity Assessment: Overall WFL for tasks assessed (MMT scores of 4+ to 5 grossly bil; denies numbness/tingling)    Lower Extremity Assessment Lower Extremity Assessment: Generalized weakness (MMT scores of 4- bil hip flexion, 4+ bil knee extension, 5 bil ankle dorsiflexion; denies numbness/tingling)    Cervical / Trunk Assessment Cervical / Trunk Assessment: Normal  Communication   Communication: No difficulties  Cognition Arousal/Alertness: Awake/alert Behavior During Therapy: Anxious Overall Cognitive Status: Within Functional Limits for tasks assessed                                 General Comments: Pt a little anxious in regards to getting SOB easily.      General Comments General comments (skin integrity, edema, etc.): SpO2 >/= 94% thorughout on RA, HR 100-120s; educated pt on pursed lip breathing, good compliance throuhgout; encouraged mobility with husband, nursing, and mobility specialists    Exercises     Assessment/Plan    PT Assessment Patient needs continued PT services  PT Problem List Decreased strength;Decreased activity tolerance;Decreased balance;Decreased mobility;Cardiopulmonary status limiting activity       PT Treatment Interventions DME instruction;Gait training;Stair training;Functional mobility training;Therapeutic activities;Balance training;Therapeutic exercise;Neuromuscular re-education;Patient/family education    PT Goals (Current goals can be found in the Care Plan section)  Acute Rehab PT Goals Patient Stated Goal: to go home soon PT Goal Formulation: With patient/family Time For Goal Achievement: 06/01/21 Potential to Achieve Goals: Good    Frequency Min 3X/week   Barriers to discharge        Co-evaluation               AM-PAC PT "6 Clicks" Mobility  Outcome Measure Help needed turning from your back to your side while in a flat bed without using bedrails?: None Help needed moving  from lying on your back to sitting on the side of a flat bed without using bedrails?: None Help needed moving to and from a bed to a chair (including a wheelchair)?: A Little Help needed standing up from a chair using your arms (e.g., wheelchair or bedside chair)?: A Little Help needed to walk in hospital room?: A Little Help needed climbing 3-5 steps with a railing? : A Little 6 Click Score: 20    End of Session Equipment Utilized During Treatment: Oxygen Activity Tolerance: Patient tolerated treatment well Patient left: in chair;with call bell/phone within reach;with family/visitor present Nurse Communication: Mobility status;Other (comment) (SpO2 levels) PT Visit Diagnosis: Unsteadiness on feet (R26.81);Other abnormalities of gait and mobility (R26.89);Muscle weakness (generalized) (M62.81)    Time: 7253-6644 PT Time Calculation (min) (ACUTE ONLY): 17 min   Charges:   PT Evaluation $PT Eval Low Complexity: 1 Low          Raymond Gurney, PT, DPT Acute Rehabilitation Services  Pager: [phone] Office: [phone]   Jewel Baize 05/25/2021, 12:18 PM

## 2021-05-26 DIAGNOSIS — J9601 Acute respiratory failure with hypoxia: Principal | ICD-10-CM

## 2021-05-26 DIAGNOSIS — J441 Chronic obstructive pulmonary disease with (acute) exacerbation: Secondary | ICD-10-CM | POA: Diagnosis not present

## 2021-05-26 DIAGNOSIS — I161 Hypertensive emergency: Secondary | ICD-10-CM | POA: Diagnosis not present

## 2021-05-26 DIAGNOSIS — B370 Candidal stomatitis: Secondary | ICD-10-CM | POA: Diagnosis not present

## 2021-05-26 MED ORDER — BREZTRI AEROSPHERE 160-9-4.8 MCG/ACT IN AERO: 2.0000 | INHALATION_SPRAY | Freq: Two times a day (BID) | RESPIRATORY_TRACT | 4 refills | Status: DC

## 2021-05-26 MED ORDER — PREDNISONE 10 MG PO TABS
ORAL_TABLET | ORAL | 0 refills | Status: DC
Start: 2021-05-27 — End: 2021-06-12

## 2021-05-26 MED ORDER — AZITHROMYCIN 500 MG PO TABS: 500.0000 mg | ORAL_TABLET | Freq: Every day | ORAL | 0 refills | Status: AC

## 2021-05-26 MED ORDER — NICOTINE 14 MG/24HR TD PT24: 14.0000 mg | MEDICATED_PATCH | Freq: Every day | TRANSDERMAL | 0 refills | Status: DC

## 2021-05-26 MED ORDER — OXYCODONE-ACETAMINOPHEN 5-325 MG PO TABS
1.0000 | ORAL_TABLET | ORAL | Status: DC | PRN
  Administered 2021-05-26: 2 via ORAL
  Filled 2021-05-26: qty 2

## 2021-05-26 MED ORDER — PREDNISONE 10 MG PO TABS: ORAL_TABLET | ORAL | 0 refills | Status: DC

## 2021-05-26 MED ORDER — FLUTICASONE PROPIONATE 50 MCG/ACT NA SUSP: 2.0000 | Freq: Every day | NASAL | 3 refills | Status: DC

## 2021-05-26 MED ORDER — NYSTATIN 100000 UNIT/ML MT SUSP: 5.0000 mL | Freq: Four times a day (QID) | OROMUCOSAL | 4 refills | Status: DC

## 2021-05-26 MED ORDER — BENZONATATE 200 MG PO CAPS: 200.0000 mg | ORAL_CAPSULE | Freq: Three times a day (TID) | ORAL | 0 refills | Status: DC | PRN

## 2021-05-26 MED ORDER — ALBUTEROL SULFATE HFA 108 (90 BASE) MCG/ACT IN AERS: 2.0000 | INHALATION_SPRAY | Freq: Every day | RESPIRATORY_TRACT | 4 refills | Status: DC

## 2021-05-26 NOTE — Discharge Summary (Signed)
Physician Discharge Summary   Patient ID: CHARISSA KNOWLES MRN: [id number] DOB/AGE: [birth year] 63 y.o.  Admit date: 05/23/2021 Discharge date: 05/26/2021  Primary Care Physician:  Ailene Ravel, MD   Recommendations for Outpatient Follow-up:  Follow up with PCP in 1-2 weeks Ambulatory referral sent to [facility]: None at baseline Equipment/Devices:   Discharge Condition: stable  CODE STATUS: FULL Diet recommendation: Heart healthy diet   Discharge Diagnoses:    Acute respiratory failure with hypoxia (HCC)  COPD exacerbation (HCC)  Weight loss, likely has pulmonary cachexia, protein calorie malnutrition  Thrush  Hypertensive urgency  Pulmonary nodules Anxiety Alcohol use Nicotine use  Consults: None    Allergies:   Allergies  Allergen Reactions   Penicillins Anaphylaxis, Swelling and Other (See Comments)    Tongue swells  Has patient had a PCN reaction causing immediate rash, facial/tongue/throat swelling, SOB or lightheadedness with hypotension: Y Has patient had a PCN reaction causing severe rash involving mucus membranes or skin necrosis: Unknown Has patient had a PCN reaction that required hospitalization: No Has patient had a PCN reaction occurring within the last 10 years: No If all of the above answers are "NO", then may proceed with Cephalosporin use.      DISCHARGE MEDICATIONS: Allergies as of 05/26/2021       Reactions   Penicillins Anaphylaxis, Swelling, Other (See Comments)   Tongue swells Has patient had a PCN reaction causing immediate rash, facial/tongue/throat swelling, SOB or lightheadedness with hypotension: Y Has patient had a PCN reaction causing severe rash involving mucus membranes or skin necrosis: Unknown Has patient had a PCN reaction that required hospitalization: No Has patient had a PCN reaction occurring within the last 10 years: No If all of the above answers are "NO", then may  proceed with Cephalosporin use.        Medication List     TAKE these medications    albuterol 108 (90 Base) MCG/ACT inhaler Commonly known as: VENTOLIN HFA Inhale 2 puffs into the lungs 6 (six) times daily.   atorvastatin 10 MG tablet Commonly known as: LIPITOR Take 10 mg by mouth in the morning.   azithromycin 500 MG tablet Commonly known as: ZITHROMAX Take 1 tablet (500 mg total) by mouth daily for 3 days.   benzonatate 200 MG capsule Commonly known as: TESSALON Take 1 capsule (200 mg total) by mouth 3 (three) times daily as needed for cough.   Breztri Aerosphere 160-9-4.8 MCG/ACT Aero Generic drug: Budeson-Glycopyrrol-Formoterol Inhale 2 puffs into the lungs in the morning and at bedtime.   busPIRone 15 MG tablet Commonly known as: BUSPAR Take 15 mg by mouth in the morning.   fluticasone 50 MCG/ACT nasal spray Commonly known as: FLONASE Place 2 sprays into both nostrils daily.   losartan 50 MG tablet Commonly known as: COZAAR Take 50 mg by mouth in the morning.   nicotine 14 mg/24hr patch Commonly known as: NICODERM CQ - dosed in mg/24 hours Place 1 patch (14 mg total) onto the skin daily.   nystatin 100000 UNIT/ML suspension Commonly known as: MYCOSTATIN Take 5 mLs (500,000 Units total) by mouth 4 (four) times daily.   predniSONE 10 MG tablet Commonly known as: DELTASONE Prednisone dosing: Take  Prednisone 40mg  (4 tabs) x 1 days, then taper to 30mg  (3 tabs) x 2 days, then 20mg  (2 tabs) x 2 days, then 10mg  (1 tab) x 2 days, then OFF. Start taking on: May 27, 2021   traZODone 50  MG tablet Commonly known as: DESYREL Take 50 mg by mouth at bedtime as needed for sleep.   VITAMIN B-12 PO Take 1 tablet by mouth in the morning.   Vitamin D3 125 MCG (5000 UT) Caps Take 5,000 Units by mouth every 30 (thirty) days.         Brief H and P: For complete details please refer to admission H and P, but in brief Patient is a 65 year old female with  hypertension, COPD tobacco abuse, alcohol abuse presented with severe shortness of breath acutely worsened.  Patient reported that she had been having trouble breathing and not feeling well over the last 1 to 2 months.  Night before admission, she was laying on the couch, doing her work on her laptop when her shortness of breath worsened.  She has been using her inhalers 4-5 times in a day on average due to her breathing.  Also reported intermittently productive cough with clear sputum and wheezing.  Patient smokes half pack per day on average and drinks anywhere from 4-5 beers per night.  Also reported sore throat, rash, weight loss in the last 3 months.  Patient was placed on CPAP due to respiratory distress by EMS, given DuoNeb, magnesium sulfate, Solu-Medrol.  At home she is not on any home O2. In ED, she was transitioned to BiPAP due to tachypnea and work of breathing.  Subsequently on improvement, transition to 2 L 2 L O2    Hospital Course:  Acute respiratory failure with hypoxia secondary to severe COPD exacerbation -Initially required CPAP due to respiratory distress by EMS, transition to BiPAP in ED. -Now much improved, off O2.  Home O2 evaluation done, does not need any O2. --Chest x-ray with hyperinflation of the lungs with emphysema, patient was placed on IV steroids.   -Improving, continue prednisone taper, albuterol inhaler, Breztri inhaler, Zithromax, -BNP 54.8 -Counseled strongly on smoking cessation. -2D echo showed EF of 70 to 75%, G1 DD, no pulmonary hypertension -Ambulatory referral sent to [facility]   Hypertensive urgency -In ED, BP was elevated up to 188/116 likely due to #1 -BP now stable, continue losartan registered dietitian was consulted   Weight loss/cachexia, underweight -Could be due to end-stage COPD.  TSH is 0.3 -CT of the chest 04/2021 had shown numerous new solid pulmonary nodules measuring less than 6 mm likely sequelae of infection or  aspiration, recommended repeat CT in 12 months -Estimated body mass index is 17.09 kg/m as calculated from the following:   Height as of this encounter: 5\' 6"  (1.676 m).   Weight as of this encounter: 48 kg. -Nutrition consult   Pulmonary nodules -Noted on CT chest 04/2021, recommended outpatient repeat CT in 12 months   Alcohol abuse -Currently not in acute withdrawals.  Patient was placed on Ativan with CIWA protocol -Counseled on alcohol cessation   Anxiety -Continue buspirone   Thrush -Continue nystatin swish and swallow   Day of Discharge S: Husband at the bedside, feels close to her baseline, hoping to go home today.  BP 121/71 (BP Location: Left Arm)   Pulse 92   Temp (!) 97.4 F (36.3 C) (Oral)   Resp 20   Ht 5\' 6"  (1.676 m)   Wt 48.9 kg Comment: scale C  SpO2 100%   BMI 17.42 kg/m   Physical Exam: General: Alert and awake oriented x3 not in any acute distress. CVS: S1-S2 clear no murmur rubs or gallops Chest: Diminished breath sounds but fairly clear, no  wheezing. Abdomen: soft nontender, nondistended, normal bowel sounds Extremities: no cyanosis, clubbing or edema noted bilaterally     Get Medicines reviewed and adjusted: Please take all your medications with you for your next visit with your Primary MD  Please request your Primary MD to go over all hospital tests and procedure/radiological results at the follow up. Please ask your Primary MD to get all Hospital records sent to his/her office.  If you experience worsening of your admission symptoms, develop shortness of breath, life threatening emergency, suicidal or homicidal thoughts you must seek medical attention immediately by calling 911 or calling your MD immediately  if symptoms less severe.  You must read complete instructions/literature along with all the possible adverse reactions/side effects for all the Medicines you take and that have been prescribed to you. Take any new Medicines after you  have completely understood and accept all the possible adverse reactions/side effects.   Do not drive when taking pain medications.   Do not take more than prescribed Pain, Sleep and Anxiety Medications  Special Instructions: If you have smoked or chewed Tobacco  in the last 2 yrs please stop smoking, stop any regular Alcohol  and or any Recreational drug use.  Wear Seat belts while driving.  Please note  You were cared for by a hospitalist during your hospital stay. Once you are discharged, your primary care physician will handle any further medical issues. Please note that NO REFILLS for any discharge medications will be authorized once you are discharged, as it is imperative that you return to your primary care physician (or establish a relationship with a primary care physician if you do not have one) for your aftercare needs so that they can reassess your need for medications and monitor your lab values.   The results of significant diagnostics from this hospitalization (including imaging, microbiology, ancillary and laboratory) are listed below for reference.      Procedures/Studies:  DG Chest Port 1 View  Result Date: 05/23/2021 CLINICAL DATA:  Shortness of breath EXAM: PORTABLE CHEST 1 VIEW COMPARISON:  03/08/2021 FINDINGS: Hyperinflation with emphysema. No focal opacity or pleural effusion. Normal cardiac size. Aortic atherosclerosis. No pneumothorax. IMPRESSION: Hyperinflation with emphysema. No acute superimposed airspace disease Electronically Signed   By: Jasmine Pang M.D.   On: 05/23/2021 23:32   ECHOCARDIOGRAM COMPLETE  Result Date: 05/25/2021    ECHOCARDIOGRAM REPORT   Patient Name:   WYNETTE JERSEY Date of Exam: 05/25/2021 Medical Rec #:  [id number]        Height:       66.0 in Accession #:    [id number]       Weight:       105.9 lb Date of Birth:  02-Mar-1956       BSA:          1.526 m Patient Age:    64 years         BP:           125/82 mmHg Patient Gender: F                 HR:           86 bpm. Exam Location:  Inpatient Procedure: 2D Echo, Cardiac Doppler and Color Doppler Indications:    Dyspnea  History:        Patient has no prior history of Echocardiogram examinations.                 Risk Factors:Hypertension and  Former Smoker.  Sonographer:    Alvera Novel Referring Phys: [id number] Hamblen K Dion Parrow IMPRESSIONS  1. Left ventricular ejection fraction, by estimation, is 70 to 75%. The left ventricle has hyperdynamic function. The left ventricle has no regional wall motion abnormalities. Left ventricular diastolic parameters are consistent with Grade I diastolic dysfunction (impaired relaxation).  2. Right ventricular systolic function is normal. The right ventricular size is normal.  3. The mitral valve is normal in structure. No evidence of mitral valve regurgitation. No evidence of mitral stenosis.  4. The aortic valve is tricuspid. Aortic valve regurgitation is not visualized. No aortic stenosis is present.  5. The inferior vena cava is normal in size with greater than 50% respiratory variability, suggesting right atrial pressure of 3 mmHg. FINDINGS  Left Ventricle: Left ventricular ejection fraction, by estimation, is 70 to 75%. The left ventricle has hyperdynamic function. The left ventricle has no regional wall motion abnormalities. The left ventricular internal cavity size was normal in size. There is no left ventricular hypertrophy. Left ventricular diastolic parameters are consistent with Grade I diastolic dysfunction (impaired relaxation). Right Ventricle: The right ventricular size is normal. Right ventricular systolic function is normal. Left Atrium: Left atrial size was normal in size. Right Atrium: Right atrial size was normal in size. Pericardium: Trivial pericardial effusion is present. Mitral Valve: The mitral valve is normal in structure. No evidence of mitral valve regurgitation. No evidence of mitral valve stenosis. Tricuspid Valve: The tricuspid valve is  normal in structure. Tricuspid valve regurgitation is trivial. No evidence of tricuspid stenosis. Aortic Valve: The aortic valve is tricuspid. Aortic valve regurgitation is not visualized. No aortic stenosis is present. Pulmonic Valve: The pulmonic valve was normal in structure. Pulmonic valve regurgitation is not visualized. No evidence of pulmonic stenosis. Aorta: The aortic root is normal in size and structure. Venous: The inferior vena cava is normal in size with greater than 50% respiratory variability, suggesting right atrial pressure of 3 mmHg. IAS/Shunts: No atrial level shunt detected by color flow Doppler.  LEFT VENTRICLE PLAX 2D LVIDd:         4.40 cm     Diastology LVIDs:         3.10 cm     LV e' medial:    7.83 cm/s LV PW:         0.80 cm     LV E/e' medial:  14.0 LV IVS:        0.90 cm     LV e' lateral:   7.40 cm/s LVOT diam:     1.60 cm     LV E/e' lateral: 14.9 LV SV:         41 LV SV Index:   27 LVOT Area:     2.01 cm  LV Volumes (MOD) LV vol d, MOD A2C: 36.5 ml LV vol d, MOD A4C: 44.0 ml LV vol s, MOD A2C: 15.2 ml LV vol s, MOD A4C: 15.7 ml LV SV MOD A2C:     21.3 ml LV SV MOD A4C:     44.0 ml LV SV MOD BP:      24.3 ml IVC IVC diam: 2.20 cm LEFT ATRIUM             Index        RIGHT ATRIUM          Index LA diam:        2.90 cm 1.90 cm/m   RA Area:     6.33  cm LA Vol (A2C):   20.7 ml 13.56 ml/m  RA Volume:   10.10 ml 6.62 ml/m LA Vol (A4C):   20.8 ml 13.63 ml/m LA Biplane Vol: 20.7 ml 13.56 ml/m  AORTIC VALVE LVOT Vmax:   111.00 cm/s LVOT Vmean:  68.000 cm/s LVOT VTI:    0.206 m  AORTA Ao Root diam: 3.00 cm Ao Asc diam:  3.40 cm MITRAL VALVE MV Area (PHT): 7.22 cm     SHUNTS MV Decel Time: 105 msec     Systemic VTI:  0.21 m MV E velocity: 110.00 cm/s  Systemic Diam: 1.60 cm MV A velocity: 125.00 cm/s MV E/A ratio:  0.88 Olga Millers MD Electronically signed by Olga Millers MD Signature Date/Time: 05/25/2021/3:57:38 PM    Final       LAB RESULTS: Basic Metabolic Panel: Recent  Labs  Lab 05/23/21 2305 05/25/21 0319  NA 134* 133*  K 4.2 4.3  CL 97* 97*  CO2 26 28  GLUCOSE 100* 148*  BUN 5* 12  CREATININE 0.63 0.60  CALCIUM 9.2 9.3  MG  --  2.1  PHOS  --  3.8   Liver Function Tests: Recent Labs  Lab 05/24/21 1746  AST 21  ALT 17  ALKPHOS 62  BILITOT 1.1  PROT 6.6  ALBUMIN 4.1   No results for input(s): LIPASE, AMYLASE in the last 168 hours. No results for input(s): AMMONIA in the last 168 hours. CBC: Recent Labs  Lab 05/23/21 2305 05/25/21 0319  WBC 9.8 6.0  NEUTROABS 6.0  --   HGB 15.2* 14.1  HCT 43.8 40.1  MCV 100.5* 98.5  PLT 210 193   Cardiac Enzymes: No results for input(s): CKTOTAL, CKMB, CKMBINDEX, TROPONINI in the last 168 hours. BNP: Invalid input(s): POCBNP CBG: No results for input(s): GLUCAP in the last 168 hours.     Disposition and Follow-up: Discharge Instructions     Ambulatory referral to Pulmonology   Complete by: As directed    Reason for referral: Asthma/COPD   Diet - low sodium heart healthy   Complete by: As directed    Increase activity slowly   Complete by: As directed         DISPOSITION: Home   DISCHARGE FOLLOW-UP  Follow-up Information     Hamrick, Maura L, MD. Schedule an appointment as soon as possible for a visit in 2 week(s).   Specialty: Family Medicine Why: for hospital follow-up Contact information: [address] [place] [address] [phone]                  Time coordinating discharge:  35 minutes  Signed:   Thad Ranger M.D. [facility] 05/26/2021, 11:28 AM

## 2021-05-26 NOTE — TOC CAGE-AID Note (Signed)
Transition of Care [facility]) - CAGE-AID Screening   Patient Details  Name: Emily Bishop MRN: [id number] Date of Birth: Dec 23, 1955  [facility] CM/SW Contact:    Emeterio Reeve, LCSW Phone Number: 05/26/2021, 12:36 PM   Clinical Narrative:  CSW met with pt and husband at bedside. Pt reports having an alcoholic beverage 9-4M a week. Pt reports she does not need resources at this time.  CAGE-AID Screening:    Have You Ever Felt You Ought to Cut Down on Your Drinking or Drug Use?: No Have People Annoyed You By Critizing Your Drinking Or Drug Use?: No Have You Felt Bad Or Guilty About Your Drinking Or Drug Use?: No Have You Ever Had a Drink or Used Drugs First Thing In The Morning to Steady Your Nerves or to Get Rid of a Hangover?: No CAGE-AID Score: 0  Substance Abuse Education Offered: Yes  Substance abuse interventions: Scientist, clinical (histocompatibility and immunogenetics), Patient Counseling   Emeterio Reeve, LCSW Clinical Social Worker

## 2021-05-26 NOTE — Progress Notes (Signed)
Patient discharged: Home with family  Via: Wheelchair   Discharge paperwork given: to patient and family  Reviewed with teach back  IV and telemetry disconnected  Belongings given to patient    

## 2021-05-26 NOTE — Progress Notes (Signed)
Physical Therapy Treatment Patient Details Name: Emily Bishop MRN: [id number] DOB: 21-Apr-1956 Today's Date: 05/26/2021   History of Present Illness Pt is a 65 y.o. female who presented 05/23/21 with SOB. Chest x-ray noted hyperinflation with emphysema without any acute airspace disease appreciated. Pt admitted with COPD exacerbation. PMH: HTN, COPD, tobacco and alcohol abuse    PT Comments    Pt making progress with mobility and activity tolerance. Pt maintained SpO2 97% or greater with ambulation. Educated pt/spouse on performing frequent short periods of activity at home with rest breaks to recover to incr strength and activity tolerance. Also instructed in repeated sit to stand exercise for home to work on leg strength and power. Pt/spouse verbalized understanding. Gave them information on [facility] outpatient Pulmonary Rehab program to look at and discuss with her primary doctor for the future.    Recommendations for follow up therapy are one component of a multi-disciplinary discharge planning process, led by the attending physician.  Recommendations may be updated based on patient status, additional functional criteria and insurance authorization.  Follow Up Recommendations  No PT follow up     Equipment Recommendations  None recommended by PT    Recommendations for Other Services       Precautions / Restrictions Precautions Precautions: None Restrictions Weight Bearing Restrictions: No     Mobility  Bed Mobility               General bed mobility comments: Pt up on EOB    Transfers Overall transfer level: Modified independent Equipment used: None Transfers: Sit to/from Stand Sit to Stand: Modified independent (Device/Increase time)            Ambulation/Gait Ambulation/Gait assistance: Modified independent (Device/Increase time) Gait Distance (Feet): 140 Feet Assistive device: None Gait Pattern/deviations: Step-through pattern;Decreased stride  length Gait velocity: decr Gait velocity interpretation: 1.31 - 2.62 ft/sec, indicative of limited community ambulator General Gait Details: Slow, steady gait without loss of balance. Pt able to complete without rest break. After completing pt sat EOB. Cues for pursed lip breathing.   Stairs             Wheelchair Mobility    Modified Rankin (Stroke Patients Only)       Balance Overall balance assessment: No apparent balance deficits (not formally assessed)                                          Cognition Arousal/Alertness: Awake/alert Behavior During Therapy: WFL for tasks assessed/performed;Anxious Overall Cognitive Status: Within Functional Limits for tasks assessed                                        Exercises      General Comments General comments (skin integrity, edema, etc.): SpO2 97% or greater on RA with activity. Dyspnea 3/4.      Pertinent Vitals/Pain Pain Assessment: No/denies pain    Home Living                      Prior Function            PT Goals (current goals can now be found in the care plan section) Acute Rehab PT Goals Patient Stated Goal: to go home soon Progress towards PT goals: Progressing toward goals  Frequency    Min 3X/week      PT Plan Current plan remains appropriate    Co-evaluation              AM-PAC PT "6 Clicks" Mobility   Outcome Measure  Help needed turning from your back to your side while in a flat bed without using bedrails?: None Help needed moving from lying on your back to sitting on the side of a flat bed without using bedrails?: None Help needed moving to and from a bed to a chair (including a wheelchair)?: None Help needed standing up from a chair using your arms (e.g., wheelchair or bedside chair)?: None Help needed to walk in hospital room?: None Help needed climbing 3-5 steps with a railing? : A Little 6 Click Score: 23    End of  Session   Activity Tolerance: Patient tolerated treatment well Patient left: in bed;with call bell/phone within reach;with family/visitor present (sitting EOB) Nurse Communication: Mobility status;Other (comment) (SpO2) PT Visit Diagnosis: Other abnormalities of gait and mobility (R26.89)     Time: 8299-3716 PT Time Calculation (min) (ACUTE ONLY): 14 min  Charges:  $Gait Training: 8-22 mins                     [facility] PT Acute Rehabilitation Services Pager [phone] Office [phone]    Angelina Ok [facility] 05/26/2021, 10:37 AM

## 2021-05-26 NOTE — Progress Notes (Signed)
SATURATION QUALIFICATIONS: (This note is used to comply with regulatory documentation for home oxygen)  Patient Saturations on Room Air at Rest = 100%  Patient Saturations on Room Air while Ambulating = 97%  Patient Saturations on N/A Liters of oxygen while Ambulating = N/A%  Please briefly explain why patient needs home oxygen:Pt does not require home oxygen.  Georga Hacking [facility] PT Acute Rehabilitation Services Pager [phone] Office [phone]

## 2021-05-26 NOTE — Progress Notes (Signed)
Pateint unable to walk and do O2 walking sats due to SOB at this time. Requested breathing th, given by this RN

## 2021-06-07 DIAGNOSIS — R7989 Other specified abnormal findings of blood chemistry: Secondary | ICD-10-CM | POA: Diagnosis not present

## 2021-06-07 DIAGNOSIS — E871 Hypo-osmolality and hyponatremia: Secondary | ICD-10-CM | POA: Diagnosis not present

## 2021-06-07 DIAGNOSIS — I10 Essential (primary) hypertension: Secondary | ICD-10-CM | POA: Diagnosis not present

## 2021-06-07 DIAGNOSIS — J449 Chronic obstructive pulmonary disease, unspecified: Secondary | ICD-10-CM | POA: Diagnosis not present

## 2021-06-11 ENCOUNTER — Institutional Professional Consult (permissible substitution): Payer: BC Managed Care – PPO | Admitting: Internal Medicine

## 2021-06-12 ENCOUNTER — Other Ambulatory Visit
Admission: RE | Admit: 2021-06-12 | Discharge: 2021-06-12 | Disposition: A | Discharge status: Home / Self Care | Payer: BC Managed Care – PPO | Source: Ambulatory Visit | Attending: Internal Medicine | Admitting: Internal Medicine

## 2021-06-12 ENCOUNTER — Other Ambulatory Visit: Payer: Self-pay

## 2021-06-12 ENCOUNTER — Encounter: Payer: Self-pay | Admitting: Internal Medicine

## 2021-06-12 ENCOUNTER — Ambulatory Visit: Payer: BC Managed Care – PPO | Admitting: Internal Medicine

## 2021-06-12 DIAGNOSIS — J449 Chronic obstructive pulmonary disease, unspecified: Secondary | ICD-10-CM

## 2021-06-12 DIAGNOSIS — E871 Hypo-osmolality and hyponatremia: Secondary | ICD-10-CM

## 2021-06-12 LAB — CBC WITH DIFFERENTIAL/PLATELET
Abs Immature Granulocytes: 0.01 10*3/uL (ref 0.00–0.07)
Basophils Absolute: 0 10*3/uL (ref 0.0–0.1)
Basophils Relative: 0 %
Eosinophils Absolute: 0.1 10*3/uL (ref 0.0–0.5)
Eosinophils Relative: 1 %
HCT: 35.9 % — ABNORMAL LOW (ref 36.0–46.0)
Hemoglobin: 12.8 g/dL (ref 12.0–15.0)
Immature Granulocytes: 0 %
Lymphocytes Relative: 22 %
Lymphs Abs: 1.6 10*3/uL (ref 0.7–4.0)
MCH: 34.4 pg — ABNORMAL HIGH (ref 26.0–34.0)
MCHC: 35.7 g/dL (ref 30.0–36.0)
MCV: 96.5 fL (ref 80.0–100.0)
Monocytes Absolute: 0.4 10*3/uL (ref 0.1–1.0)
Monocytes Relative: 6 %
Neutro Abs: 5.1 10*3/uL (ref 1.7–7.7)
Neutrophils Relative %: 71 %
Platelets: 211 10*3/uL (ref 150–400)
RBC: 3.72 MIL/uL — ABNORMAL LOW (ref 3.87–5.11)
RDW: 12.3 % (ref 11.5–15.5)
WBC: 7.2 10*3/uL (ref 4.0–10.5)
nRBC: 0 % (ref 0.0–0.2)

## 2021-06-12 LAB — BASIC METABOLIC PANEL
Anion gap: 8 (ref 5–15)
BUN: 6 mg/dL — ABNORMAL LOW (ref 8–23)
CO2: 26 mmol/L (ref 22–32)
Calcium: 8.8 mg/dL — ABNORMAL LOW (ref 8.9–10.3)
Chloride: 93 mmol/L — ABNORMAL LOW (ref 98–111)
Creatinine, Ser: 0.59 mg/dL (ref 0.44–1.00)
GFR, Estimated: >60 mL/min (ref >60)
Glucose, Bld: 94 mg/dL (ref 70–99)
Potassium: 3.6 mmol/L (ref 3.5–5.1)
Sodium: 127 mmol/L — ABNORMAL LOW (ref 135–145)

## 2021-06-12 MED ORDER — ALBUTEROL SULFATE (2.5 MG/3ML) 0.083% IN NEBU: 2.5000 mg | INHALATION_SOLUTION | RESPIRATORY_TRACT | Status: DC | PRN

## 2021-06-12 MED ORDER — PREDNISONE 10 MG PO TABS: ORAL_TABLET | ORAL | 0 refills | Status: DC

## 2021-06-12 NOTE — Progress Notes (Signed)
Emily Bishop, female    DOB: [birth year], 65 y.o.   MRN: [id number]   Brief patient profile:  39   yowf  quit smoking  05/2021 referred to pulmonary clinic in [facility]  06/12/2021 by Dr Tana Coast  for copd eval      s/p admit   D/c date 05/26/21 Hx   H/o  hypertension, COPD tobacco use, alcohol abuse presented with severe shortness of breath acutely worsened.  Patient reported that she had been having trouble breathing and not feeling well over the last 1 to 2 months.  Night before admission as she lay on the couch, doing her work on her laptop when her shortness of breath worsened.  She has been using her inhalers 4-5 times in a day on average due to her breathing.  Also reported intermittently productive cough with clear sputum and wheezing.  Patient smokes half pack per day on average and drinks anywhere from 4-5 beers per night.  Also reported sore throat, rash, weight loss in the last 3 months.  Patient was placed on CPAP due to respiratory distress by EMS, given DuoNeb, magnesium sulfate, Solu-Medrol.  At home she is not on any home O2. In ED, she was transitioned to BiPAP due to tachypnea and work of breathing.  Subsequently on improvement, transition to 2 L 2 L O2     Hospital Course:  Acute respiratory failure with hypoxia secondary to severe COPD exacerbation -Initially required CPAP due to respiratory distress by EMS, transition to BiPAP in ED. - Home O2 evaluation done, does not need any O2. --Chest x-ray with hyperinflation of the lungs with emphysema, patient was placed on IV steroids.   -Improving, continue prednisone taper, albuterol inhaler, Breztri inhaler, Zithromax, -BNP 54.8 -Counseled strongly on smoking cessation. -2D echo showed EF of 70 to 75%, G1 DD, no pulmonary hypertension -Ambulatory referral sent to [facility] [facility]   Hypertensive urgency -In ED, BP was elevated up to 188/116 likely due to #1 -BP now stable, continue losartan registered  dietitian was consulted   Weight loss/cachexia, underweight -Could be due to end-stage COPD.  TSH is 0.3 -CT of the chest 04/2021 had shown numerous new solid pulmonary nodules measuring less than 6 mm likely sequelae of infection or aspiration, recommended repeat CT in 12 months -Estimated body mass index is 17.09 kg/m as calculated from the following:   Height as of this encounter: 5\' 6"  (1.676 m).   Weight as of this encounter: 48 kg. -Nutrition consult   Pulmonary nodules -Noted on CT chest 04/2021, recommended outpatient repeat CT in 12 months   Alcohol abuse -Currently not in acute withdrawals.  Patient was placed on Ativan with CIWA protocol -Counseled on alcohol cessation   Anxiety -Continue buspirone   Thrush -Continue nystatin swish and swallow       History of Present Illness  06/12/2021  Pulmonary/ 1st office eval/ [facility] Camp[facility] / [place] on breztri  off prednisone since 06/05/21  Chief Complaint  Patient presents with   Consult    Copd- coughing and sob  Dyspnea:  slower than nl pace at baseline on prn saba and better since on Breztri  Cough: rattling smoker's cough / mucus clear  Sleep: lots of pillows x ? Amt  SABA use: twice daily hfa and neb Felt much better on prednisone   No obvious day to day or daytime variability or assoc excess/ purulent sputum or mucus plugs or hemoptysis or cp or chest tightness, subjective  wheeze or overt sinus or hb symptoms.   Sleeping as above without nocturnal  or early am exacerbation  of respiratory  c/o's or need for noct saba. Also denies any obvious fluctuation of symptoms with weather or environmental changes or other aggravating or alleviating factors except as outlined above   No unusual exposure hx or h/o childhood pna/ asthma or knowledge of premature birth.  Current Allergies, Complete Past Medical History, Past Surgical History, Family History, and Social History were reviewed in [employer] record.  ROS  The following are not active complaints unless bolded Hoarseness, sore throat, dysphagia, dental problems, itching, sneezing,  nasal congestion or discharge of excess mucus or purulent secretions, ear ache,   fever, chills, sweats, unintended wt loss or wt gain, classically pleuritic or exertional cp,  orthopnea pnd or arm/hand swelling  or leg swelling, presyncope, palpitations, abdominal pain, anorexia, nausea, vomiting, diarrhea  or change in bowel habits or change in bladder habits, change in stools or change in urine, dysuria, hematuria,  rash, arthralgias, visual complaints, headache, numbness, weakness or ataxia or problems with walking or coordination,  change in mood or  memory.             Past Medical History:  Diagnosis Date   COPD exacerbation (HCC) 05/24/2021   History of blood transfusion    Hypercholesteremia    Hypertension     Outpatient Medications Prior to Visit  Medication Sig Dispense Refill   albuterol (VENTOLIN HFA) 108 (90 Base) MCG/ACT inhaler Inhale 2 puffs into the lungs 6 (six) times daily. 18 g 4   atorvastatin (LIPITOR) 10 MG tablet Take 10 mg by mouth in the morning.  5   benzonatate (TESSALON) 200 MG capsule Take 1 capsule (200 mg total) by mouth 3 (three) times daily as needed for cough. 20 capsule 0   BREZTRI AEROSPHERE 160-9-4.8 MCG/ACT AERO Inhale 2 puffs into the lungs in the morning and at bedtime. 10.7 g 4   busPIRone (BUSPAR) 15 MG tablet Take 15 mg by mouth in the morning.     Cholecalciferol (VITAMIN D3) 5000 units CAPS Take 5,000 Units by mouth every 30 (thirty) days.     Cyanocobalamin (VITAMIN B-12 PO) Take 1 tablet by mouth in the morning.     fluticasone (FLONASE) 50 MCG/ACT nasal spray Place 2 sprays into both nostrils daily. 16 g 3   losartan (COZAAR) 50 MG tablet Take 50 mg by mouth in the morning.  5   nicotine (NICODERM CQ - DOSED IN MG/24 HOURS) 14 mg/24hr patch Place 1 patch (14 mg total) onto the skin daily. 28  patch 0   nystatin (MYCOSTATIN) 100000 UNIT/ML suspension Take 5 mLs (500,000 Units total) by mouth 4 (four) times daily. 60 mL 4   predniSONE (DELTASONE) 10 MG tablet Prednisone dosing: Take  Prednisone 40mg  (4 tabs) x 1 days, then taper to 30mg  (3 tabs) x 2 days, then 20mg  (2 tabs) x 2 days, then 10mg  (1 tab) x 2 days, then OFF. 16 tablet 0   traZODone (DESYREL) 50 MG tablet Take 50 mg by mouth at bedtime as needed for sleep.     No facility-administered medications prior to visit.     Objective:     BP (!) 148/88 (BP Location: Left Arm, Patient Position: Sitting, Cuff Size: Normal)   Pulse 96   Temp (!) 97.1 F (36.2 C) (Oral)   Ht 5' 6.5" (1.689 m)   Wt 108 lb 6.4 oz (49.2  kg)   SpO2 98%   BMI 17.23 kg/m   SpO2: 98 %RA  W/c bound elderly wf nad   HEENT : pt wearing mask not removed for exam due to covid -19 concerns.    NECK :  without JVD/Nodes/TM/ nl carotid upstrokes bilaterally   LUNGS: no acc muscle use,  Mod barrel  contour chest wall with bilateral  Distant bs s audible wheeze and  without cough on insp or exp maneuvers and mod  Hyperresonant  to  percussion bilaterally     CV:  RRR  no s3 or murmur or increase in P2, and no edema   ABD:  soft and nontender with pos mid insp Hoover's  in the supine position. No bruits or organomegaly appreciated, bowel sounds nl  MS:     ext warm without deformities, calf tenderness, cyanosis or clubbing No obvious joint restrictions   SKIN: warm and dry without lesions    NEURO:  alert, approp, nl sensorium with  no motor or cerebellar deficits apparent.        I personally reviewed images and agree with radiology impression as follows:  CXR:   portable 05/23/21 Hyperinflation with emphysema. No acute superimposed airspace disease     Labs ordered 06/12/2021  :  allergy profile   alpha one AT phenotype    Labs ordered/ reviewed:      Chemistry      Component Value Date/Time   NA 127 (L) 06/12/2021 1447   K  3.6 06/12/2021 1447   CL 93 (L) 06/12/2021 1447   CO2 26 06/12/2021 1447   BUN 6 (L) 06/12/2021 1447   CREATININE 0.59 06/12/2021 1447      Component Value Date/Time   CALCIUM 8.8 (L) 06/12/2021 1447   ALKPHOS 62 05/24/2021 1746   AST 21 05/24/2021 1746   ALT 17 05/24/2021 1746   BILITOT 1.1 05/24/2021 1746        Lab Results  Component Value Date   WBC 7.2 06/12/2021   HGB 12.8 06/12/2021   HCT 35.9 (L) 06/12/2021   MCV 96.5 06/12/2021   PLT 211 06/12/2021        Lab Results  Component Value Date   TSH 0.361 05/24/2021         Assessment   Outpatient Encounter Medications as of 06/12/2021  Medication Sig   albuterol (PROVENTIL) (2.5 MG/3ML) 0.083% nebulizer solution Take 3 mLs (2.5 mg total) by nebulization every 4 (four) hours as needed.   albuterol (VENTOLIN HFA) 108 (90 Base) MCG/ACT inhaler Inhale 2 puffs into the lungs 6 (six) times daily.   atorvastatin (LIPITOR) 10 MG tablet Take 10 mg by mouth in the morning.   benzonatate (TESSALON) 200 MG capsule Take 1 capsule (200 mg total) by mouth 3 (three) times daily as needed for cough.   BREZTRI AEROSPHERE 160-9-4.8 MCG/ACT AERO Inhale 2 puffs into the lungs in the morning and at bedtime.   busPIRone (BUSPAR) 15 MG tablet Take 15 mg by mouth in the morning.   Cholecalciferol (VITAMIN D3) 5000 units CAPS Take 5,000 Units by mouth every 30 (thirty) days.   Cyanocobalamin (VITAMIN B-12 PO) Take 1 tablet by mouth in the morning.   fluticasone (FLONASE) 50 MCG/ACT nasal spray Place 2 sprays into both nostrils daily.   losartan (COZAAR) 50 MG tablet Take 50 mg by mouth in the morning.   nicotine (NICODERM CQ - DOSED IN MG/24 HOURS) 14 mg/24hr patch Place 1 patch (14 mg total) onto the  skin daily.   nystatin (MYCOSTATIN) 100000 UNIT/ML suspension Take 5 mLs (500,000 Units total) by mouth 4 (four) times daily.   predniSONE (DELTASONE) 10 MG tablet Take  4 each am x 2 days,   2 each am x 2 days,  1 each am x 2 days and stop    traZODone (DESYREL) 50 MG tablet Take 50 mg by mouth at bedtime as needed for sleep.        COPD  GOLD ? / group D  Quit smoking 05/2021  - 06/12/2021  After extensive coaching inhaler device,  effectiveness =   50% (short ti)  > continue breztri with pred x 6 d as plan D - 06/12/2021   Walked on RA x  2  lap(s) =  approx 315ft @ moderate pace, stopped due to sob  with lowest 02 sats 97%  -  Labs ordered 06/12/2021  :  allergy profile Eos 0.1 / IgE   alpha one AT phenotype    Clearly severe copd and  Group D in terms of symptom/risk and laba/lama/ICS  therefore appropriate rx at this point >>>  Continue breztri as Plan A and approp saba action plan B and C (see avs for instructions unique to this ov) with Prednisone x 6 days prn plan D to prevent future admits.    Also discussed: I took an extended  opportunity with this patient to outline the consequences of continued cigarette use  in airway disorders based on all the data we have from the multiple national lung health studies (perfomed over decades at millions of dollars in cost)  indicating that maintaining smoking cessation, not choice of inhalers or physicians, is the most important aspect of her care.      Hyponatremia Dx 06/12/2021  Vs baseline 132 for years   >>> rec initially =  avoid excess fluids   No obvious medications listed as potential causes but obviously needs to avoid thiazides and restrict fluids and f/u with PCP in a few weeks as SIADH is a possibility here though no obvious reason for that at  is point and reset osmostat most likely if she remains stable in this range.    Each maintenance medication was reviewed in detail including emphasizing most importantly the difference between maintenance and prns and under what circumstances the prns are to be triggered using an action plan format where appropriate.  Total time for H and P, chart review, counseling, reviewing hfa/neb device(s) , directly observing portions of  ambulatory 02 saturation study/ and generating customized AVS unique to this office visit / same day charting  > 45 min                 Christinia Gully, MD 06/12/2021

## 2021-06-12 NOTE — Patient Instructions (Addendum)
Plan A = Automatic = Always=    Breztri Take 2 puffs first thing in am and then another 2 puffs about 12 hours later.    Work on inhaler technique:  relax and gently blow all the way out then take a nice smooth full deep breath back in, triggering the inhaler at same time you start breathing in.  Hold for up to 5 seconds if you can. Blow out thru nose. Rinse and gargle with water when done.  If mouth or throat bother you at all,  try brushing teeth/gums/tongue with arm and hammer toothpaste/ make a slurry and gargle and spit out.    Plan B = Backup (to supplement plan A, not to replace it) Only use your albuterol inhaler (as a rescue medication to be used if you can't catch your breath by resting or doing a relaxed purse lip breathing pattern.  - The less you use it, the better it will work when you need it. - Ok to use the inhaler up to 2 puffs  every 4 hours if you must but call for appointment if use goes up over your usual need - Don't leave home without it !!  (think of it like the spare tire for your car)   Plan C = Crisis (instead of Plan B but only if Plan B stops working) - only use your albuterol nebulizer if you first try Plan B and it fails to help > ok to use the nebulizer up to every 4 hours but if start needing it regularly call for immediate appointment   Plan D = Deltasone  =  prednisone  - take 6 days if ABC not working - Prednisone 10 mg take  4 each am x 2 days,   2 each am x 2 days,  1 each am x 2 days and stop   Plan E = ER go there if above isn't working   Please remember to go to the lab department   for your tests - we will call you with the results when they are available.      Please schedule a follow up visit in  6 weeks but call sooner if needed with pfts on return

## 2021-06-13 ENCOUNTER — Encounter: Payer: Self-pay | Admitting: Internal Medicine

## 2021-06-13 DIAGNOSIS — E871 Hypo-osmolality and hyponatremia: Secondary | ICD-10-CM | POA: Insufficient documentation

## 2021-06-13 NOTE — Assessment & Plan Note (Addendum)
Quit smoking 05/2021  - 06/12/2021  After extensive coaching inhaler device,  effectiveness =   50% (short ti)  > continue breztri with pred x 6 d as plan D - 06/12/2021   Walked on RA x  2  lap(s) =  approx 333ft @ moderate pace, stopped due to sob  with lowest 02 sats 97%  -  Labs ordered 06/12/2021  :  allergy profile Eos 0.1 / IgE   alpha one AT phenotype    Clearly severe copd and  Group D in terms of symptom/risk and laba/lama/ICS  therefore appropriate rx at this point >>>  Continue breztri as Plan A and approp saba action plan B and C (see avs for instructions unique to this ov) with Prednisone x 6 days prn plan D to prevent future admits.    Also discussed: I took an extended  opportunity with this patient to outline the consequences of continued cigarette use  in airway disorders based on all the data we have from the multiple national lung health studies (perfomed over decades at millions of dollars in cost)  indicating that maintaining smoking cessation, not choice of inhalers or physicians, is the most important aspect of her care.    Each maintenance medication was reviewed in detail including emphasizing most importantly the difference between maintenance and prns and under what circumstances the prns are to be triggered using an action plan format where appropriate.  Total time for H and P, chart review, counseling, reviewing hfa/neb device(s) , directly observing portions of ambulatory 02 saturation study/ and generating customized AVS unique to this office visit / same day charting  > 45 min

## 2021-06-13 NOTE — Assessment & Plan Note (Signed)
Dx 06/12/2021  Vs baseline 132 for years   >>> rec initially =  avoid excess fluids   No obvious medications listed as potential causes but obviously needs to avoid thiazides and restrict fluids and f/u with PCP in a few weeks as SIADH is a possibility here though no obvious reason for that at  is point and reset osmostat most likely if she remains stable in this range.

## 2021-06-15 LAB — ALPHA-1-ANTITRYPSIN PHENOTYP: A-1 Antitrypsin, Ser: 199 mg/dL — ABNORMAL HIGH (ref 101–187)

## 2021-06-18 LAB — IGE: IgE (Immunoglobulin E), Serum: 152 IU/mL (ref 6–495)

## 2021-07-03 DIAGNOSIS — E871 Hypo-osmolality and hyponatremia: Secondary | ICD-10-CM | POA: Diagnosis not present

## 2021-09-19 DIAGNOSIS — E538 Deficiency of other specified B group vitamins: Secondary | ICD-10-CM | POA: Diagnosis not present

## 2021-09-19 DIAGNOSIS — J449 Chronic obstructive pulmonary disease, unspecified: Secondary | ICD-10-CM | POA: Diagnosis not present

## 2021-09-19 DIAGNOSIS — I10 Essential (primary) hypertension: Secondary | ICD-10-CM | POA: Diagnosis not present

## 2021-09-19 DIAGNOSIS — E782 Mixed hyperlipidemia: Secondary | ICD-10-CM | POA: Diagnosis not present

## 2021-10-03 ENCOUNTER — Other Ambulatory Visit: Payer: Self-pay | Admitting: Family Medicine

## 2021-10-03 DIAGNOSIS — Z1231 Encounter for screening mammogram for malignant neoplasm of breast: Secondary | ICD-10-CM

## 2021-10-15 DIAGNOSIS — E538 Deficiency of other specified B group vitamins: Secondary | ICD-10-CM | POA: Diagnosis not present

## 2021-10-15 DIAGNOSIS — E782 Mixed hyperlipidemia: Secondary | ICD-10-CM | POA: Diagnosis not present

## 2021-10-15 DIAGNOSIS — Z79899 Other long term (current) drug therapy: Secondary | ICD-10-CM | POA: Diagnosis not present

## 2022-03-14 ENCOUNTER — Encounter: Payer: Self-pay | Admitting: Internal Medicine

## 2022-03-14 ENCOUNTER — Ambulatory Visit: Payer: Self-pay | Admitting: Internal Medicine

## 2022-03-14 ENCOUNTER — Telehealth: Payer: Self-pay | Admitting: Internal Medicine

## 2022-03-14 ENCOUNTER — Other Ambulatory Visit: Payer: Self-pay | Admitting: Internal Medicine

## 2022-03-14 DIAGNOSIS — J449 Chronic obstructive pulmonary disease, unspecified: Secondary | ICD-10-CM

## 2022-03-14 MED ORDER — PREDNISONE 10 MG PO TABS: ORAL_TABLET | ORAL | 11 refills | Status: DC

## 2022-03-14 MED ORDER — FORMOTEROL FUMARATE 20 MCG/2ML IN NEBU: INHALATION_SOLUTION | RESPIRATORY_TRACT | 11 refills | Status: DC

## 2022-03-14 MED ORDER — YUPELRI 175 MCG/3ML IN SOLN: 175.0000 ug | Freq: Every day | RESPIRATORY_TRACT | 11 refills | Status: DC

## 2022-03-14 NOTE — Assessment & Plan Note (Addendum)
Quit smoking 05/2021 /MM - 06/12/2021  After extensive coaching inhaler device,  effectiveness =   50% (short ti)  > continue breztri with pred x 6 d as plan D - 06/12/2021   Walked on RA x  2  lap(s) =  approx 346ft @ moderate pace, stopped due to sob  with lowest 02 sats 97%  -  Labs ordered 06/12/2021  :  allergy profile Eos 0.1 / IgE 152   alpha one AT phenotype  MM  199 -  03/14/2022  After extensive coaching inhaler device,  effectiveness =    0  - 03/14/2022 changed to yupelri/ performist qam with optional perforomist in pm also plus Pred x 6d  as Plan D   Group D (now reclassified as E) in terms of symptom/risk and laba/lama/ICS  therefore appropriate rx at this point >>>  Start with neb lama/laba and if needing freq pred then add pulmicort back to rx with neb version since she's not capable of adequate insp technique.   Re SABA :  I spent extra time with pt today reviewing appropriate use of albuterol for prn use on exertion with the following points: 1) saba is for relief of sob that does not improve by walking a slower pace or resting but rather if the pt does not improve after trying this first. 2) If the pt is convinced, as many are, that saba helps recover from activity faster then it's easy to tell if this is the case by re-challenging : ie stop, take the inhaler, then p 5 minutes try the exact same activity (intensity of workload) that just caused the symptoms and see if they are substantially diminished or not after saba 3) if there is an activity that reproducibly causes the symptoms, try the saba 15 min before the activity on alternate days   If in fact the saba really does help, then fine to continue to use it prn but advised may need to look closer at the maintenance regimen being used to achieve better control of airways disease with exertion.    >> d/c all vaping / f/u with pfts in [place] w/in 3 m          Each maintenance medication was reviewed in detail including  emphasizing most importantly the difference between maintenance and prns and under what circumstances the prns are to be triggered using an action plan format where appropriate.  Total time for H and P, chart review, counseling, reviewing hfa/neb device(s) and generating customized AVS unique to this office visit / same day charting = > 30 min for    refractory respiratory  symptoms of uncertain etiology

## 2022-03-14 NOTE — Progress Notes (Signed)
Emily Bishop, female    DOB: 1956-07-21, 66 y.o.   MRN: [id number]   Brief patient profile:  65   yowf  quit smoking  05/2021/ vaping referred to pulmonary clinic in [facility]  06/12/2021 by Dr Isidoro Donning  for copd eval s/p admit   D/c date 05/26/21 Hx   H/o  hypertension, COPD tobacco use, alcohol abuse presented with severe shortness of breath acutely worsened.  Patient reported that she had been having trouble breathing and not feeling well over the last 1 to 2 months.  Night before admission as she lay on the couch, doing her work on her laptop when her shortness of breath worsened.  She has been using her inhalers 4-5 times in a day on average due to her breathing.  Also reported intermittently productive cough with clear sputum and wheezing.  Patient smokes half pack per day on average and drinks anywhere from 4-5 beers per night.  Also reported sore throat, rash, weight loss in the last 3 months.  Patient was placed on CPAP due to respiratory distress by EMS, given DuoNeb, magnesium sulfate, Solu-Medrol.  At home she is not on any home O2. In ED, she was transitioned to BiPAP due to tachypnea and work of breathing.  Subsequently on improvement, transition to 2 L 2 L O2     Hospital Course:  Acute respiratory failure with hypoxia secondary to severe COPD exacerbation -Initially required CPAP due to respiratory distress by EMS, transition to BiPAP in ED. - Home O2 evaluation done, does not need any O2. --Chest x-ray with hyperinflation of the lungs with emphysema, patient was placed on IV steroids.   -Improving, continue prednisone taper, albuterol inhaler, Breztri inhaler, Zithromax, -BNP 54.8 -Counseled strongly on smoking cessation. -2D echo showed EF of 70 to 75%, G1 DD, no pulmonary hypertension -Ambulatory referral sent to [facility] at [facility]   Hypertensive urgency -In ED, BP was elevated up to 188/116 likely due to #1 -BP now stable, continue losartan registered  dietitian was consulted   Weight loss/cachexia, underweight -Could be due to end-stage COPD.  TSH is 0.3 -CT of the chest 04/2021 had shown numerous new solid pulmonary nodules measuring less than 6 mm likely sequelae of infection or aspiration, recommended repeat CT in 12 months -Estimated body mass index is 17.09 kg/m as calculated from the following:   Height as of this encounter: 5\' 6"  (1.676 m).   Weight as of this encounter: 48 kg. -Nutrition consult   Pulmonary nodules -Noted on CT chest 04/2021, recommended outpatient repeat CT in 12 months   Alcohol abuse -Currently not in acute withdrawals.  Patient was placed on Ativan with CIWA protocol -Counseled on alcohol cessation   Anxiety -Continue buspirone   Thrush -Continue nystatin swish and swallow       History of Present Illness  06/12/2021  Pulmonary/ 1st office eval/ Darsha Zumstein / [employer]  Office maint on breztri  off prednisone since 06/05/21  Chief Complaint  Patient presents with   Consult    Copd- coughing and sob  Dyspnea:  slower than nl pace at baseline on prn saba and better since on Breztri  Cough: rattling smoker's cough / mucus clear  Sleep: lots of pillows x ? Amt  SABA use: twice daily hfa and neb Felt much better on prednisone   Rec Plan A = Automatic = Always=    Breztri Take 2 puffs first thing in am and then another 2 puffs about 12 hours later.  Work on inhaler technique: Plan B = Backup (to supplement plan A, not to replace it) Only use your albuterol inhaler as a rescue medication  Plan C = Crisis (instead of Plan B but only if Plan B stops working) - only use your albuterol nebulizer if you first try Plan B and it fails to help > ok to use the nebulizer up to every 4 hours but if start needing it regularly call for immediate appointment Plan D = Deltasone  =  prednisone  - take 6 days if ABC not working - Prednisone 10 mg take  4 each am x 2 days,   2 each am x 2 days,  1 each am x 2 days and stop   Plan E = ER go there if above isn't working   Please remember to go to the lab department   for your tests - we will call you with the results when they are available.      Please schedule a follow up visit in  6 weeks but call sooner if needed with pfts on return  03/14/2022  f/u ov/Brandi Tomlinson re: copd severe  clinically   maint on breztri and alb and prednisone as plan D  / hfa technique now 0 Chief Complaint  Patient presents with   Follow-up    LOV 06/2021. SOB with exertion.   Dyspnea:  very sedentary / room to room doe slow Cough: none  Sleeping: sleeping flat/ no am cough  SABA use: neb once or twice a week  02: none  Covid status:   vax x 3/ never infected    No obvious day to day or daytime variability or assoc excess/ purulent sputum or mucus plugs or hemoptysis or cp or chest tightness, subjective wheeze or overt sinus or hb symptoms.   Sleeping  without nocturnal  or early am exacerbation  of respiratory  c/o's or need for noct saba. Also denies any obvious fluctuation of symptoms with weather or environmental changes or other aggravating or alleviating factors except as outlined above   No unusual exposure hx or h/o childhood pna/ asthma or knowledge of premature birth.  Current Allergies, Complete Past Medical History, Past Surgical History, Family History, and Social History were reviewed in [employer] record.  ROS  The following are not active complaints unless bolded Hoarseness, sore throat, dysphagia, dental problems, itching, sneezing,  nasal congestion or discharge of excess mucus or purulent secretions, ear ache,   fever, chills, sweats, unintended wt loss or wt gain, classically pleuritic or exertional cp,  orthopnea pnd or arm/hand swelling  or leg swelling, presyncope, palpitations, abdominal pain, anorexia, nausea, vomiting, diarrhea  or change in bowel habits or change in bladder habits, change in stools or change in urine, dysuria, hematuria,   rash, arthralgias, visual complaints, headache, numbness, weakness or ataxia or problems with walking or coordination,  change in mood or  memory.        Current Meds  Medication Sig   albuterol (PROVENTIL) (2.5 MG/3ML) 0.083% nebulizer solution Take 3 mLs (2.5 mg total) by nebulization every 4 (four) hours as needed.   albuterol (VENTOLIN HFA) 108 (90 Base) MCG/ACT inhaler Inhale 2 puffs into the lungs 6 (six) times daily.   atorvastatin (LIPITOR) 10 MG tablet Take 10 mg by mouth in the morning.   BREZTRI AEROSPHERE 160-9-4.8 MCG/ACT AERO Inhale 2 puffs into the lungs in the morning and at bedtime.   busPIRone (BUSPAR) 15 MG tablet Take 15  mg by mouth in the morning.   fluticasone (FLONASE) 50 MCG/ACT nasal spray Place 2 sprays into both nostrils daily.   losartan (COZAAR) 50 MG tablet Take 50 mg by mouth in the morning.   Multiple Vitamin (MULTIVITAMIN) tablet Take 1 tablet by mouth daily.   traZODone (DESYREL) 50 MG tablet Take 50 mg by mouth at bedtime as needed for sleep.   [DISCONTINUED] benzonatate (TESSALON) 200 MG capsule Take 1 capsule (200 mg total) by mouth 3 (three) times daily as needed for cough.   [DISCONTINUED] Cholecalciferol (VITAMIN D3) 5000 units CAPS Take 5,000 Units by mouth every 30 (thirty) days.   [DISCONTINUED] Cyanocobalamin (VITAMIN B-12 PO) Take 1 tablet by mouth in the morning.   [DISCONTINUED] nicotine (NICODERM CQ - DOSED IN MG/24 HOURS) 14 mg/24hr patch Place 1 patch (14 mg total) onto the skin daily.   [DISCONTINUED] nystatin (MYCOSTATIN) 100000 UNIT/ML suspension Take 5 mLs (500,000 Units total) by mouth 4 (four) times daily.            Past Medical History:  Diagnosis Date   COPD exacerbation (HCC) 05/24/2021   History of blood transfusion    Hypercholesteremia    Hypertension       Objective:     Wt Readings from Last 3 Encounters:  03/14/22 134 lb 6.4 oz (61 kg)  06/12/21 108 lb 6.4 oz (49.2 kg)  05/26/21 107 lb 14.4 oz (48.9 kg)       Vital signs reviewed  03/14/2022  - Note at rest 02 sats  98% on RA   General appearance:    chronically ill amb wf nad at rest but tremulous  HEENT :  Oropharynx  clear   Nasal turbinates nl    NECK :  without JVD/Nodes/TM/ nl carotid upstrokes bilaterally   LUNGS: no acc muscle use,  Mod barrel  contour chest wall with bilateral  Distant bs s audible wheeze and  without cough on insp or exp maneuvers and mod  Hyperresonant  to  percussion bilaterally     CV:  RRR  no s3 or murmur or increase in P2, and no edema   ABD:  soft and nontender with pos mid insp Hoover's  in the supine position. No bruits or organomegaly appreciated, bowel sounds nl  MS:   Ext warm without deformities or   obvious joint restrictions , calf tenderness, cyanosis or clubbing  SKIN: warm and dry without lesions    NEURO:  alert, approp, nl sensorium with  no motor or cerebellar deficits apparent.                          Assessment

## 2022-03-14 NOTE — Patient Instructions (Addendum)
Plan A = Automatic = Always=    formoterol and yupelri 1st thing in am per neb and add just formoterol 12 hour later if you feel you need it for your breathing   Plan B = Backup (to supplement plan A, not to replace it) Only use your albuterol inhaler as a rescue medication to be used if you can't catch your breath by resting or doing a relaxed purse lip breathing pattern.  - The less you use it, the better it will work when you need it. - Ok to use the inhaler up to 2 puffs  every 4 hours if you must but call for appointment if use goes up over your usual need - Don't leave home without it !!  (think of it like the spare tire for your car)   Plan C = Crisis (instead of Plan B but only if Plan B stops working) - only use your albuterol nebulizer if you first try Plan B and it fails to help > ok to use the nebulizer up to every 4 hours but if start needing it regularly call for immediate appointment   Plan D = Deltasone  If ABC aren't working take Prednisone 10 mg take  4 each am x 2 days,   2 each am x 2 days,  1 each am x 2 days and stop   Plan E = ER - go to ER or call 911 if all else fails   PFT's next available in [place]   Stop all vaping   Please schedule a follow up visit in 3 months but call sooner if needed in [facility]

## 2022-03-15 NOTE — Telephone Encounter (Signed)
Sending to PA team

## 2022-03-18 ENCOUNTER — Other Ambulatory Visit (HOSPITAL_COMMUNITY): Payer: Self-pay

## 2022-03-19 MED ORDER — IPRATROPIUM-ALBUTEROL 0.5-2.5 (3) MG/3ML IN SOLN: 3.0000 mL | Freq: Four times a day (QID) | RESPIRATORY_TRACT | 2 refills | Status: DC | PRN

## 2022-03-19 NOTE — Telephone Encounter (Signed)
Delsa Bern, CPhT to Me      03/18/22 11:41 AM Mikael Spray is listed as non-formulary for this patient plan. Prior authorization is not offered, but can be attempted. Other LAMA products under this plan return the following co-pays:  Incruse $75  Spiriva (handihaler or respimat) $75. Savings card/eVoucher available that would bring patient cost to $34.99  Tudorza non formulary.   There are currently no other LAMA nebulized products available; if an alternate nebulized product is preferred for this patients therapy, please advise.

## 2022-03-19 NOTE — Telephone Encounter (Signed)
Order placed for DuoNeb per Dr Sherene Sires orders. And Dr Sherene Sires will go over other recommendations at next office visit with patient. Nothing further needed

## 2022-03-19 NOTE — Telephone Encounter (Signed)
Best option is duoneb qid in place of brezrri  until next ov and regroup then

## 2022-04-03 ENCOUNTER — Other Ambulatory Visit: Payer: Self-pay | Admitting: Family Medicine

## 2022-04-03 DIAGNOSIS — Z1231 Encounter for screening mammogram for malignant neoplasm of breast: Secondary | ICD-10-CM

## 2022-06-19 ENCOUNTER — Ambulatory Visit: Payer: Self-pay | Admitting: Student in an Organized Health Care Education/Training Program

## 2023-04-23 ENCOUNTER — Other Ambulatory Visit: Payer: Self-pay | Admitting: Internal Medicine

## 2023-07-08 ENCOUNTER — Emergency Department: Payer: PPO

## 2023-07-08 ENCOUNTER — Inpatient Hospital Stay
Admission: EM | Admit: 2023-07-08 | Discharge: 2023-08-06 | DRG: 208 | Disposition: E | Payer: PPO | Attending: Internal Medicine | Admitting: Internal Medicine

## 2023-07-08 ENCOUNTER — Other Ambulatory Visit: Payer: Self-pay

## 2023-07-08 ENCOUNTER — Encounter: Payer: Self-pay | Admitting: Internal Medicine

## 2023-07-08 DIAGNOSIS — Z515 Encounter for palliative care: Secondary | ICD-10-CM | POA: Diagnosis not present

## 2023-07-08 DIAGNOSIS — E874 Mixed disorder of acid-base balance: Secondary | ICD-10-CM | POA: Diagnosis present

## 2023-07-08 DIAGNOSIS — R6510 Systemic inflammatory response syndrome (SIRS) of non-infectious origin without acute organ dysfunction: Secondary | ICD-10-CM | POA: Diagnosis not present

## 2023-07-08 DIAGNOSIS — J441 Chronic obstructive pulmonary disease with (acute) exacerbation: Secondary | ICD-10-CM | POA: Diagnosis not present

## 2023-07-08 DIAGNOSIS — E872 Acidosis, unspecified: Secondary | ICD-10-CM | POA: Diagnosis not present

## 2023-07-08 DIAGNOSIS — A419 Sepsis, unspecified organism: Secondary | ICD-10-CM | POA: Diagnosis not present

## 2023-07-08 DIAGNOSIS — I10 Essential (primary) hypertension: Secondary | ICD-10-CM | POA: Diagnosis not present

## 2023-07-08 DIAGNOSIS — G9341 Metabolic encephalopathy: Secondary | ICD-10-CM | POA: Diagnosis not present

## 2023-07-08 DIAGNOSIS — E871 Hypo-osmolality and hyponatremia: Secondary | ICD-10-CM | POA: Diagnosis present

## 2023-07-08 DIAGNOSIS — E876 Hypokalemia: Secondary | ICD-10-CM | POA: Diagnosis not present

## 2023-07-08 DIAGNOSIS — Z87891 Personal history of nicotine dependence: Secondary | ICD-10-CM

## 2023-07-08 DIAGNOSIS — Z79899 Other long term (current) drug therapy: Secondary | ICD-10-CM

## 2023-07-08 DIAGNOSIS — R0602 Shortness of breath: Secondary | ICD-10-CM | POA: Diagnosis not present

## 2023-07-08 DIAGNOSIS — E8809 Other disorders of plasma-protein metabolism, not elsewhere classified: Secondary | ICD-10-CM | POA: Diagnosis not present

## 2023-07-08 DIAGNOSIS — R739 Hyperglycemia, unspecified: Secondary | ICD-10-CM | POA: Diagnosis not present

## 2023-07-08 DIAGNOSIS — I959 Hypotension, unspecified: Secondary | ICD-10-CM | POA: Diagnosis not present

## 2023-07-08 DIAGNOSIS — J9601 Acute respiratory failure with hypoxia: Secondary | ICD-10-CM | POA: Diagnosis present

## 2023-07-08 DIAGNOSIS — R0609 Other forms of dyspnea: Secondary | ICD-10-CM | POA: Diagnosis not present

## 2023-07-08 DIAGNOSIS — R062 Wheezing: Secondary | ICD-10-CM | POA: Diagnosis not present

## 2023-07-08 DIAGNOSIS — I2510 Atherosclerotic heart disease of native coronary artery without angina pectoris: Secondary | ICD-10-CM | POA: Diagnosis present

## 2023-07-08 DIAGNOSIS — I9589 Other hypotension: Secondary | ICD-10-CM | POA: Diagnosis not present

## 2023-07-08 DIAGNOSIS — E7800 Pure hypercholesterolemia, unspecified: Secondary | ICD-10-CM | POA: Diagnosis not present

## 2023-07-08 DIAGNOSIS — Z4682 Encounter for fitting and adjustment of non-vascular catheter: Secondary | ICD-10-CM | POA: Diagnosis not present

## 2023-07-08 DIAGNOSIS — F1010 Alcohol abuse, uncomplicated: Secondary | ICD-10-CM | POA: Diagnosis present

## 2023-07-08 DIAGNOSIS — Z66 Do not resuscitate: Secondary | ICD-10-CM | POA: Diagnosis not present

## 2023-07-08 DIAGNOSIS — Z880 Allergy status to penicillin: Secondary | ICD-10-CM

## 2023-07-08 DIAGNOSIS — J189 Pneumonia, unspecified organism: Secondary | ICD-10-CM | POA: Diagnosis not present

## 2023-07-08 DIAGNOSIS — Z1152 Encounter for screening for COVID-19: Secondary | ICD-10-CM | POA: Diagnosis not present

## 2023-07-08 DIAGNOSIS — E878 Other disorders of electrolyte and fluid balance, not elsewhere classified: Secondary | ICD-10-CM | POA: Diagnosis not present

## 2023-07-08 DIAGNOSIS — J9622 Acute and chronic respiratory failure with hypercapnia: Secondary | ICD-10-CM | POA: Diagnosis present

## 2023-07-08 DIAGNOSIS — R918 Other nonspecific abnormal finding of lung field: Secondary | ICD-10-CM | POA: Diagnosis not present

## 2023-07-08 DIAGNOSIS — T380X5A Adverse effect of glucocorticoids and synthetic analogues, initial encounter: Secondary | ICD-10-CM | POA: Diagnosis not present

## 2023-07-08 DIAGNOSIS — R059 Cough, unspecified: Secondary | ICD-10-CM | POA: Diagnosis not present

## 2023-07-08 DIAGNOSIS — R000 Tachycardia, unspecified: Secondary | ICD-10-CM | POA: Diagnosis not present

## 2023-07-08 DIAGNOSIS — J4489 Other specified chronic obstructive pulmonary disease: Secondary | ICD-10-CM | POA: Diagnosis not present

## 2023-07-08 DIAGNOSIS — J9602 Acute respiratory failure with hypercapnia: Secondary | ICD-10-CM | POA: Diagnosis not present

## 2023-07-08 DIAGNOSIS — J9621 Acute and chronic respiratory failure with hypoxia: Secondary | ICD-10-CM | POA: Diagnosis not present

## 2023-07-08 DIAGNOSIS — J449 Chronic obstructive pulmonary disease, unspecified: Secondary | ICD-10-CM | POA: Diagnosis present

## 2023-07-08 DIAGNOSIS — E875 Hyperkalemia: Secondary | ICD-10-CM | POA: Diagnosis not present

## 2023-07-08 DIAGNOSIS — J9690 Respiratory failure, unspecified, unspecified whether with hypoxia or hypercapnia: Secondary | ICD-10-CM | POA: Diagnosis not present

## 2023-07-08 DIAGNOSIS — F419 Anxiety disorder, unspecified: Secondary | ICD-10-CM | POA: Diagnosis not present

## 2023-07-08 DIAGNOSIS — J439 Emphysema, unspecified: Secondary | ICD-10-CM | POA: Diagnosis not present

## 2023-07-08 DIAGNOSIS — R0689 Other abnormalities of breathing: Secondary | ICD-10-CM | POA: Diagnosis not present

## 2023-07-08 DIAGNOSIS — T380X1A Poisoning by glucocorticoids and synthetic analogues, accidental (unintentional), initial encounter: Secondary | ICD-10-CM | POA: Diagnosis not present

## 2023-07-08 DIAGNOSIS — I700 Atherosclerosis of aorta: Secondary | ICD-10-CM | POA: Diagnosis not present

## 2023-07-08 LAB — BLOOD GAS, VENOUS
Acid-Base Excess: 1.8 mmol/L (ref 0.0–2.0)
Bicarbonate: 28.6 mmol/L — ABNORMAL HIGH (ref 20.0–28.0)
O2 Saturation: 92.6 %
Patient temperature: 37
pCO2, Ven: 53 mm[Hg] (ref 44–60)
pH, Ven: 7.34 (ref 7.25–7.43)
pO2, Ven: 64 mm[Hg] — ABNORMAL HIGH (ref 32–45)

## 2023-07-08 LAB — CBC
HCT: 33.2 % — ABNORMAL LOW (ref 36.0–46.0)
Hemoglobin: 11.6 g/dL — ABNORMAL LOW (ref 12.0–15.0)
MCH: 33 pg (ref 26.0–34.0)
MCHC: 34.9 g/dL (ref 30.0–36.0)
MCV: 94.6 fL (ref 80.0–100.0)
Platelets: 253 10*3/uL (ref 150–400)
RBC: 3.51 MIL/uL — ABNORMAL LOW (ref 3.87–5.11)
RDW: 13.1 % (ref 11.5–15.5)
WBC: 7.8 10*3/uL (ref 4.0–10.5)
nRBC: 0 % (ref 0.0–0.2)

## 2023-07-08 LAB — BASIC METABOLIC PANEL
Anion gap: 11 (ref 5–15)
BUN: 9 mg/dL (ref 8–23)
CO2: 24 mmol/L (ref 22–32)
Calcium: 9.2 mg/dL (ref 8.9–10.3)
Chloride: 92 mmol/L — ABNORMAL LOW (ref 98–111)
Creatinine, Ser: 0.63 mg/dL (ref 0.44–1.00)
GFR, Estimated: >60 mL/min (ref >60)
Glucose, Bld: 184 mg/dL — ABNORMAL HIGH (ref 70–99)
Potassium: 5.5 mmol/L — ABNORMAL HIGH (ref 3.5–5.1)
Sodium: 127 mmol/L — ABNORMAL LOW (ref 135–145)

## 2023-07-08 LAB — COMPREHENSIVE METABOLIC PANEL
ALT: 10 U/L (ref 0–44)
AST: 12 U/L — ABNORMAL LOW (ref 15–41)
Albumin: 2.2 g/dL — ABNORMAL LOW (ref 3.5–5.0)
Alkaline Phosphatase: 52 U/L (ref 38–126)
Anion gap: 10 (ref 5–15)
BUN: <5 mg/dL — ABNORMAL LOW (ref 8–23)
CO2: 13 mmol/L — ABNORMAL LOW (ref 22–32)
Calcium: 5.5 mg/dL — CL (ref 8.9–10.3)
Chloride: 113 mmol/L — ABNORMAL HIGH (ref 98–111)
Creatinine, Ser: 0.34 mg/dL — ABNORMAL LOW (ref 0.44–1.00)
GFR, Estimated: >60 mL/min (ref >60)
Glucose, Bld: 89 mg/dL (ref 70–99)
Potassium: 2.1 mmol/L — CL (ref 3.5–5.1)
Sodium: 136 mmol/L (ref 135–145)
Total Bilirubin: 0.6 mg/dL (ref <1.2)
Total Protein: 4.2 g/dL — ABNORMAL LOW (ref 6.5–8.1)

## 2023-07-08 LAB — MAGNESIUM: Magnesium: 2.6 mg/dL — ABNORMAL HIGH (ref 1.7–2.4)

## 2023-07-08 LAB — RESP PANEL BY RT-PCR (RSV, FLU A&B, COVID)  RVPGX2
Influenza A by PCR: NEGATIVE
Influenza B by PCR: NEGATIVE
Resp Syncytial Virus by PCR: NEGATIVE
SARS Coronavirus 2 by RT PCR: NEGATIVE

## 2023-07-08 LAB — PHOSPHORUS: Phosphorus: 3.5 mg/dL (ref 2.5–4.6)

## 2023-07-08 LAB — TSH: TSH: 0.287 u[IU]/mL — ABNORMAL LOW (ref 0.350–4.500)

## 2023-07-08 MED ORDER — FLUTICASONE PROPIONATE 50 MCG/ACT NA SUSP
2.0000 | Freq: Every day | NASAL | Status: DC
  Filled 2023-07-08: qty 16

## 2023-07-08 MED ORDER — ONDANSETRON HCL 4 MG/2ML IJ SOLN
4.0000 mg | Freq: Four times a day (QID) | INTRAMUSCULAR | Status: DC | PRN
  Administered 2023-07-11: 4 mg via INTRAVENOUS
  Filled 2023-07-08: qty 2

## 2023-07-08 MED ORDER — IPRATROPIUM-ALBUTEROL 0.5-2.5 (3) MG/3ML IN SOLN
3.0000 mL | Freq: Once | RESPIRATORY_TRACT | Status: AC
  Administered 2023-07-08: 3 mL via RESPIRATORY_TRACT
  Filled 2023-07-08: qty 3

## 2023-07-08 MED ORDER — QUETIAPINE FUMARATE 25 MG PO TABS
25.0000 mg | ORAL_TABLET | Freq: Once | ORAL | Status: AC
  Administered 2023-07-08: 25 mg via ORAL
  Filled 2023-07-08: qty 1

## 2023-07-08 MED ORDER — MAGNESIUM SULFATE IN D5W 1-5 GM/100ML-% IV SOLN
1.0000 g | Freq: Once | INTRAVENOUS | Status: AC
  Administered 2023-07-08: 1 g via INTRAVENOUS
  Filled 2023-07-08: qty 100

## 2023-07-08 MED ORDER — TRAZODONE HCL 50 MG PO TABS
50.0000 mg | ORAL_TABLET | Freq: Every evening | ORAL | Status: DC | PRN
  Administered 2023-07-08: 50 mg via ORAL
  Filled 2023-07-08: qty 1

## 2023-07-08 MED ORDER — IPRATROPIUM-ALBUTEROL 0.5-2.5 (3) MG/3ML IN SOLN
3.0000 mL | Freq: Four times a day (QID) | RESPIRATORY_TRACT | Status: DC
  Administered 2023-07-08 (×2): 3 mL via RESPIRATORY_TRACT
  Filled 2023-07-08 (×3): qty 3

## 2023-07-08 MED ORDER — PREDNISONE 10 MG PO TABS: 40.0000 mg | ORAL_TABLET | Freq: Every day | ORAL | Status: DC

## 2023-07-08 MED ORDER — CALCIUM GLUCONATE-NACL 1-0.675 GM/50ML-% IV SOLN
1.0000 g | Freq: Once | INTRAVENOUS | Status: AC
  Administered 2023-07-08: 1000 mg via INTRAVENOUS
  Filled 2023-07-08: qty 50

## 2023-07-08 MED ORDER — ALUM & MAG HYDROXIDE-SIMETH 200-200-20 MG/5ML PO SUSP
30.0000 mL | Freq: Once | ORAL | Status: AC
  Administered 2023-07-08: 30 mL via ORAL
  Filled 2023-07-08: qty 30

## 2023-07-08 MED ORDER — ENOXAPARIN SODIUM 40 MG/0.4ML IJ SOSY
40.0000 mg | PREFILLED_SYRINGE | INTRAMUSCULAR | Status: DC
  Administered 2023-07-08 – 2023-07-10 (×3): 40 mg via SUBCUTANEOUS
  Filled 2023-07-08 (×3): qty 0.4

## 2023-07-08 MED ORDER — POTASSIUM CHLORIDE CRYS ER 20 MEQ PO TBCR
40.0000 meq | EXTENDED_RELEASE_TABLET | ORAL | Status: AC
  Administered 2023-07-08 (×2): 40 meq via ORAL
  Filled 2023-07-08 (×2): qty 2

## 2023-07-08 MED ORDER — LORAZEPAM 2 MG/ML IJ SOLN
1.0000 mg | Freq: Once | INTRAMUSCULAR | Status: AC
  Administered 2023-07-09: 1 mg via INTRAVENOUS
  Filled 2023-07-08: qty 1

## 2023-07-08 MED ORDER — LOSARTAN POTASSIUM 50 MG PO TABS: 50.0000 mg | ORAL_TABLET | Freq: Every morning | ORAL | Status: DC

## 2023-07-08 MED ORDER — ARFORMOTEROL TARTRATE 15 MCG/2ML IN NEBU
15.0000 ug | INHALATION_SOLUTION | Freq: Two times a day (BID) | RESPIRATORY_TRACT | Status: DC
  Administered 2023-07-08: 15 ug via RESPIRATORY_TRACT
  Filled 2023-07-08 (×2): qty 2

## 2023-07-08 MED ORDER — GUAIFENESIN ER 600 MG PO TB12
600.0000 mg | ORAL_TABLET | Freq: Two times a day (BID) | ORAL | Status: DC
  Administered 2023-07-08 (×2): 600 mg via ORAL
  Filled 2023-07-08 (×2): qty 1

## 2023-07-08 MED ORDER — POTASSIUM CHLORIDE 10 MEQ/100ML IV SOLN
10.0000 meq | Freq: Once | INTRAVENOUS | Status: AC
  Administered 2023-07-08: 10 meq via INTRAVENOUS
  Filled 2023-07-08: qty 100

## 2023-07-08 MED ORDER — BUSPIRONE HCL 10 MG PO TABS
15.0000 mg | ORAL_TABLET | Freq: Two times a day (BID) | ORAL | Status: DC
  Administered 2023-07-08 (×2): 15 mg via ORAL
  Filled 2023-07-08: qty 2
  Filled 2023-07-08: qty 3

## 2023-07-08 MED ORDER — ALBUTEROL SULFATE (2.5 MG/3ML) 0.083% IN NEBU
2.5000 mg | INHALATION_SOLUTION | RESPIRATORY_TRACT | Status: DC | PRN
  Administered 2023-07-08 (×2): 2.5 mg via RESPIRATORY_TRACT
  Filled 2023-07-08 (×3): qty 3

## 2023-07-08 MED ORDER — OLANZAPINE 10 MG IM SOLR
5.0000 mg | Freq: Once | INTRAMUSCULAR | Status: AC | PRN
  Administered 2023-07-08: 5 mg via INTRAMUSCULAR
  Filled 2023-07-08: qty 10

## 2023-07-08 MED ORDER — LIDOCAINE VISCOUS HCL 2 % MT SOLN
15.0000 mL | Freq: Once | OROMUCOSAL | Status: AC
  Administered 2023-07-08: 15 mL via ORAL
  Filled 2023-07-08: qty 15

## 2023-07-08 MED ORDER — REVEFENACIN 175 MCG/3ML IN SOLN
175.0000 ug | Freq: Every day | RESPIRATORY_TRACT | Status: DC
  Administered 2023-07-08: 175 ug via RESPIRATORY_TRACT
  Filled 2023-07-08 (×2): qty 3

## 2023-07-08 MED ORDER — ATORVASTATIN CALCIUM 10 MG PO TABS: 10.0000 mg | ORAL_TABLET | Freq: Every morning | ORAL | Status: DC

## 2023-07-08 MED ORDER — BUSPIRONE HCL 5 MG PO TABS: 15.0000 mg | ORAL_TABLET | Freq: Every morning | ORAL | Status: DC

## 2023-07-08 MED ORDER — DOXYCYCLINE HYCLATE 100 MG PO TABS
100.0000 mg | ORAL_TABLET | Freq: Two times a day (BID) | ORAL | Status: DC
  Administered 2023-07-08 (×2): 100 mg via ORAL
  Filled 2023-07-08 (×2): qty 1

## 2023-07-08 MED ORDER — ONDANSETRON HCL 4 MG PO TABS: 4.0000 mg | ORAL_TABLET | Freq: Four times a day (QID) | ORAL | Status: DC | PRN

## 2023-07-08 MED ORDER — ACETAMINOPHEN 500 MG PO TABS
1000.0000 mg | ORAL_TABLET | Freq: Once | ORAL | Status: AC
  Administered 2023-07-08: 1000 mg via ORAL
  Filled 2023-07-08: qty 2

## 2023-07-08 MED ORDER — POTASSIUM CHLORIDE CRYS ER 20 MEQ PO TBCR
40.0000 meq | EXTENDED_RELEASE_TABLET | Freq: Once | ORAL | Status: AC
  Administered 2023-07-08: 40 meq via ORAL
  Filled 2023-07-08: qty 2

## 2023-07-08 MED ORDER — METHYLPREDNISOLONE SODIUM SUCC 125 MG IJ SOLR
125.0000 mg | Freq: Once | INTRAMUSCULAR | Status: AC
  Administered 2023-07-08: 125 mg via INTRAVENOUS
  Filled 2023-07-08: qty 2

## 2023-07-08 MED ORDER — HYDRALAZINE HCL 20 MG/ML IJ SOLN
5.0000 mg | Freq: Four times a day (QID) | INTRAMUSCULAR | Status: DC | PRN
  Administered 2023-07-08: 5 mg via INTRAVENOUS
  Filled 2023-07-08: qty 1

## 2023-07-08 MED ORDER — MAGNESIUM SULFATE 50 % IJ SOLN: 1.0000 g | Freq: Once | INTRAMUSCULAR | Status: DC

## 2023-07-08 NOTE — ED Notes (Signed)
Critical lab value Potassium: 2.1 Calcium: 5.5  Dr. Lenard Lance notified at 1225

## 2023-07-08 NOTE — ED Provider Notes (Signed)
[facility] Provider Note    Event Date/Time   First MD Initiated Contact with Patient 07/08/23 1127     (approximate)  History   Chief Complaint: Shortness of Breath  HPI  Emily Bishop is a 67 y.o. female with a past medical history of COPD, hypertension, hyperlipidemia presents to the emergency department for shortness of breath.  According to the patient for the past 3 to 4 days she has had sinus congestion and shortness of breath.  Patient has a history of COPD but no oxygen requirement at baseline.  Physical Exam   Triage Vital Signs: ED Triage Vitals  Encounter Vitals Group     BP 07/08/23 1137 (!) 183/84     Systolic BP Percentile --      Diastolic BP Percentile --      Pulse Rate 07/08/23 1137 (!) 120     Resp 07/08/23 1137 18     Temp 07/08/23 1137 99.6 F (37.6 C)     Temp Source 07/08/23 1137 Oral     SpO2 07/08/23 1137 95 %     Weight --      Height --      Head Circumference --      Peak Flow --      Pain Score 07/08/23 1138 0     Pain Loc --      Pain Education --      Exclude from Growth Chart --     Most recent vital signs: Vitals:   07/08/23 1137  BP: (!) 183/84  Pulse: (!) 120  Resp: 18  Temp: 99.6 F (37.6 C)  SpO2: 95%    General: Awake, sitting upright in bed, mild respiratory distress. CV:  Good peripheral perfusion.  Regular rate and rhythm around 100 bpm. Resp:  Mild tachypnea around 20 to 25 breaths/min.  Patient has diminished breath sounds bilaterally with expiratory wheeze auscultated bilaterally. Abd:  No distention.  Soft, nontender.  No rebound or guarding.  ED Results / Procedures / Treatments   EKG  EKG viewed and interpreted by myself shows sinus tachycardia 115 bpm with a narrow QRS, normal axis, normal intervals, no concerning ST changes.  RADIOLOGY  I have reviewed and interpreted chest x-ray images.  No obvious consolidation on my evaluation. Radiology is read the x-ray as  hyperinflation compatible COPD   MEDICATIONS ORDERED IN ED: Medications  ipratropium-albuterol (DUONEB) 0.5-2.5 (3) MG/3ML nebulizer solution 3 mL (has no administration in time range)  ipratropium-albuterol (DUONEB) 0.5-2.5 (3) MG/3ML nebulizer solution 3 mL (has no administration in time range)     IMPRESSION / MDM / ASSESSMENT AND PLAN / ED COURSE  I reviewed the triage vital signs and the nursing notes.  Patient's presentation is most consistent with acute presentation with potential threat to life or bodily function.  Patient presents emergency department for worsening shortness of breath as well as cough and congestion x 3 days.  Patient has a history of COPD.  Patient has diminished breath sounds with expiratory wheeze on exam most consistent with COPD exacerbation.  Currently satting in the low to mid 90s on room air with no baseline O2 requirement.  We will check labs, chest x-ray dose 2 additional DuoNebs (patient received 2 DuoNebs and Solu-Medrol by EMS prior to arrival).  We will continue to close monitor while awaiting further results.  Patient's workup shows a reassuring CBC however chemistry shows hypokalemia with hypocalcemia.  COVID/flu/RSV is negative, chest x-ray reassuring.  Highly  suspect COPD exacerbation in combination with a hypokalemia hypocalcemia we will admit to the hospitalist service for further workup and treatment as well as electrolyte repletion.  Patient agreeable to plan.  FINAL CLINICAL IMPRESSION(S) / ED DIAGNOSES   COPD exacerbation Dyspnea Hypocalcemia Hypokalemia  Note:  This document was prepared using Dragon voice recognition software and may include unintentional dictation errors.   Minna Antis, MD 07/08/23 [phone]

## 2023-07-08 NOTE — ED Triage Notes (Signed)
Pt. To ED via EMS for SOB increasing in severity x3 days. Pt. Has hx. Of COPD. Pt. States productive cough x3 days with yellow/grey sputum. Pt. Denies fever, n/v/d.

## 2023-07-08 NOTE — H&P (Signed)
History and Physical    Emily Bishop NWG:[id number] DOB: [birth year] DOA: 07/08/2023  PCP: Ailene Ravel, MD (Confirm with patient/family/NH records and if not entered, this has to be entered at [facility] point of entry) Patient coming from: Home  I have personally briefly reviewed patient's old medical records in [facility]  Chief Complaint: Wheezing, cough, SOB  HPI: Emily Bishop is a 67 y.o. female with medical history significant of COPD, HTN, HLD, anxiety, presented with increasing wheezing and shortness of breath along with productive cough.  Symptoms started productive cough of yellowish sputum production and increasing wheezing and shortness of breath for 3 days.  Denies any chest pain no fever or chills.  She uses albuterol around-the-clock with minimal help and decided come into the hospital.  At baseline patient appears to have very poor exercise tolerance can only walk 3 to 5 minutes before starting to feel shortness of breath.  Patient denied any abdominal pain no nauseous vomiting no diarrhea.  She eats balanced diet denied any weight loss.  ED Course: Temperature 99.6, borderline tachycardia, blood pressure 130/80 ulceration left 5% on 3 L.  Chest x-ray negative for acute infiltrates.  Patient was given DuoNebs, IV and p.o. potassium and IV calcium x 1 in the ED.  Review of Systems: As per HPI otherwise 14 point review of systems negative.    Past Medical History:  Diagnosis Date   COPD exacerbation (HCC) 05/24/2021   History of blood transfusion    Hypercholesteremia    Hypertension     Past Surgical History:  Procedure Laterality Date   EXTERNAL FIXATION REMOVAL Left 03/20/2018   Procedure: REMOVAL EXTERNAL FIXATION  LEFT LEG;  Surgeon: Myrene Galas, MD;  Location: [facility] OR;  Service: Orthopedics;  Laterality: Left;   EYE SURGERY Left    retina occulsion   ORIF ANKLE FRACTURE Left 02/08/2018   Procedure: external fixation left ankle;  Surgeon: Signa Kell,  MD;  Location: [facility] ORS;  Service: Orthopedics;  Laterality: Left;     reports that she quit smoking about 2 years ago. Her smoking use included cigarettes and e-cigarettes. She started smoking about 22 years ago. She has a 10 pack-year smoking history. She has been exposed to tobacco smoke. She has never used smokeless tobacco. She reports that she does not currently use alcohol after a past usage of about 28.0 standard drinks of alcohol per week. She reports that she does not use drugs.  Allergies  Allergen Reactions   Penicillins Anaphylaxis, Swelling and Other (See Comments)    Tongue swells  Has patient had a PCN reaction causing immediate rash, facial/tongue/throat swelling, SOB or lightheadedness with hypotension: Y Has patient had a PCN reaction causing severe rash involving mucus membranes or skin necrosis: Unknown Has patient had a PCN reaction that required hospitalization: No Has patient had a PCN reaction occurring within the last 10 years: No If all of the above answers are "NO", then may proceed with Cephalosporin use.     No family history on file.   Prior to Admission medications   Medication Sig Start Date End Date Taking? Authorizing Provider  albuterol (PROVENTIL) (2.5 MG/3ML) 0.083% nebulizer solution Take 3 mLs (2.5 mg total) by nebulization every 4 (four) hours as needed. 06/12/21   Nyoka Cowden, MD  albuterol (VENTOLIN HFA) 108 (90 Base) MCG/ACT inhaler Inhale 2 puffs into the lungs 6 (six) times daily. 05/26/21   Rai, Delene Ruffini, MD  atorvastatin (LIPITOR) 10 MG tablet Take  10 mg by mouth in the morning. 01/28/18   [provider]  busPIRone (BUSPAR) 15 MG tablet Take 15 mg by mouth in the morning. 04/10/21   [provider]  fluticasone (FLONASE) 50 MCG/ACT nasal spray Place 2 sprays into both nostrils daily. 05/26/21   Rai, Delene Ruffini, MD  formoterol (PERFOROMIST) 20 MCG/2ML nebulizer solution Use in nebulizer twice daily perfectly regularly  03/14/22   Nyoka Cowden, MD  ipratropium-albuterol (DUONEB) 0.5-2.5 (3) MG/3ML SOLN Take 3 mLs by nebulization every 6 (six) hours as needed. 03/19/22   Nyoka Cowden, MD  losartan (COZAAR) 50 MG tablet Take 50 mg by mouth in the morning. 02/01/18   [provider]  Multiple Vitamin (MULTIVITAMIN) tablet Take 1 tablet by mouth daily.    [provider]  predniSONE (DELTASONE) 10 MG tablet Take  4 each am x 2 days,   2 each am x 2 days,  1 each am x 2 days and stop 03/14/22   Nyoka Cowden, MD  traZODone (DESYREL) 50 MG tablet Take 50 mg by mouth at bedtime as needed for sleep. 03/16/21   [provider]  YUPELRI 175 MCG/3ML nebulizer solution TAKE 3 MLS (175 MCG TOTAL) BY NEBULIZATION DAILY. 03/14/22   Nyoka Cowden, MD    Physical Exam: Vitals:   07/08/23 1137 07/08/23 1138 07/08/23 1237  BP: (!) 183/84  138/82  Pulse: (!) 120  (!) 110  Resp: 18  20  Temp: 99.6 F (37.6 C)    TempSrc: Oral    SpO2: 95%  99%  Weight:  61 kg   Height:  5\' 6"  (1.676 m)     Constitutional: NAD, calm, comfortable Vitals:   07/08/23 1137 07/08/23 1138 07/08/23 1237  BP: (!) 183/84  138/82  Pulse: (!) 120  (!) 110  Resp: 18  20  Temp: 99.6 F (37.6 C)    TempSrc: Oral    SpO2: 95%  99%  Weight:  61 kg   Height:  5\' 6"  (1.676 m)    Eyes: PERRL, lids and conjunctivae normal ENMT: Mucous membranes are moist. Posterior pharynx clear of any exudate or lesions.Normal dentition.  Neck: normal, supple, no masses, no thyromegaly Respiratory: Diminished breathing sound bilaterally, scattered wheezing, no crackles.  Increasing respiratory effort. No accessory muscle use.  Cardiovascular: Regular rate and rhythm, no murmurs / rubs / gallops. No extremity edema. 2+ pedal pulses. No carotid bruits.  Abdomen: no tenderness, no masses palpated. No hepatosplenomegaly. Bowel sounds positive.  Musculoskeletal: no clubbing / cyanosis. No joint deformity upper and lower extremities. Good  ROM, no contractures. Normal muscle tone.  Skin: no rashes, lesions, ulcers. No induration Neurologic: CN 2-12 grossly intact. Sensation intact, DTR normal. Strength 5/5 in all 4.  Psychiatric: Normal judgment and insight. Alert and oriented x 3. Normal mood.     Labs on Admission: I have personally reviewed following labs and imaging studies  CBC: Recent Labs  Lab 07/08/23 1146  WBC 7.8  HGB 11.6*  HCT 33.2*  MCV 94.6  PLT 253   Basic Metabolic Panel: Recent Labs  Lab 07/08/23 1146  NA 136  K 2.1*  CL 113*  CO2 13*  GLUCOSE 89  BUN <5*  CREATININE 0.34*  CALCIUM 5.5*   GFR: Estimated Creatinine Clearance: 65.6 mL/min (A) (by C-G formula based on SCr of 0.34 mg/dL (L)). Liver Function Tests: Recent Labs  Lab 07/08/23 1146  AST 12*  ALT 10  ALKPHOS 52  BILITOT  0.6  PROT 4.2*  ALBUMIN 2.2*   No results for input(s): "LIPASE", "AMYLASE" in the last 168 hours. No results for input(s): "AMMONIA" in the last 168 hours. Coagulation Profile: No results for input(s): "INR", "PROTIME" in the last 168 hours. Cardiac Enzymes: No results for input(s): "CKTOTAL", "CKMB", "CKMBINDEX", "TROPONINI" in the last 168 hours. BNP (last 3 results) No results for input(s): "PROBNP" in the last 8760 hours. HbA1C: No results for input(s): "HGBA1C" in the last 72 hours. CBG: No results for input(s): "GLUCAP" in the last 168 hours. Lipid Profile: No results for input(s): "CHOL", "HDL", "LDLCALC", "TRIG", "CHOLHDL", "LDLDIRECT" in the last 72 hours. Thyroid Function Tests: No results for input(s): "TSH", "T4TOTAL", "FREET4", "T3FREE", "THYROIDAB" in the last 72 hours. Anemia Panel: No results for input(s): "VITAMINB12", "FOLATE", "FERRITIN", "TIBC", "IRON", "RETICCTPCT" in the last 72 hours. Urine analysis: No results found for: "COLORURINE", "APPEARANCEUR", "LABSPEC", "PHURINE", "GLUCOSEU", "HGBUR", "BILIRUBINUR", "KETONESUR", "PROTEINUR", "UROBILINOGEN", "NITRITE",  "LEUKOCYTESUR"  Radiological Exams on Admission: DG Chest 2 View  Result Date: 07/08/2023 CLINICAL DATA:  SOB, cough EXAM: CHEST - 2 VIEW COMPARISON:  Chest radiograph dated May 23, 2021 FINDINGS: The heart size and mediastinal contours are within normal limits. Aortic atherosclerosis. Hyperinflation with upper lobe predominant emphysematous changes, compatible with history of COPD. No focal consolidation. No pleural effusion or pneumothorax. No acute osseous abnormality. IMPRESSION: Hyperinflation with upper lobe predominant emphysema, compatible with history of COPD. No focal consolidation. Electronically Signed   By: Hart Robinsons M.D.   On: 07/08/2023 12:35    EKG: Independently reviewed.  Sinus tachycardia, appears to have no significant ST changes.  Assessment/Plan Principal Problem:   COPD (chronic obstructive pulmonary disease) (HCC) Active Problems:   COPD  GOLD ? / group D    Acute respiratory failure with hypoxia (HCC)  (please populate well all problems here in Problem List. (For example, if patient is on BP meds at home and you resume or decide to hold them, it is a problem that needs to be her. Same for CAD, COPD, HLD and so on)  Acute hypoxic respiratory failure Acute COPD exacerbation -IV Solu-Medrol x 1 then start p.o. steroid -Incentive spirometry and flutter valve -Start doxycycline -Continue LABA and ICS -Every 6 hours DuoNebs and as needed albuterol -Will check ambulatory pulse ox for home O2 evaluation -VBG to rule out CO2 retention  SIRS -Secondary to COPD exacerbation, management as above -Patient appears to be euvolemic, we will hold off maintenance IV fluid  Severe hypokalemia -IV and p.o. replacement -Clinically suspect patient has over ventilation and respiratory alkalosis, will check VBG -Dose of IV magnesium given.  Magnesium and phosphorus level pending  Non-anion gap metabolic acidosis -Probably acute, suspect this is compensatory changes  secondary to respiratory alkalosis.  Check VBG  Severe hypocalcemia -IV calcium  Severe hypoalbuminemia -Check urine for urine protein, clinically patient does not have any peripheral edema, low suspicion for nephrotic etiology -Expect outpatient follow-up with an/or GI for further workup  HTN -Continue losartan  Anxiety -1 dose of p.o. Seroquel -Continue at bedtime trazodone  DVT prophylaxis: Lovenox Code Status: Full code Family Communication: Husband at bedside Disposition Plan: Patient is sick with severe COPD exacerbation and profound electrolyte abnormalities and acid-base abnormalities, requiring close inpatient monitoring and replenishment, expect more than 2 midnight hospital stay Consults called: None Admission status: Telemetry admission   Emeline General MD [facility] Pager [phone]  07/08/2023, 2:06 PM

## 2023-07-09 ENCOUNTER — Inpatient Hospital Stay: Payer: PPO

## 2023-07-09 DIAGNOSIS — E871 Hypo-osmolality and hyponatremia: Secondary | ICD-10-CM | POA: Diagnosis not present

## 2023-07-09 DIAGNOSIS — J441 Chronic obstructive pulmonary disease with (acute) exacerbation: Secondary | ICD-10-CM

## 2023-07-09 DIAGNOSIS — J9601 Acute respiratory failure with hypoxia: Secondary | ICD-10-CM | POA: Diagnosis not present

## 2023-07-09 DIAGNOSIS — I10 Essential (primary) hypertension: Secondary | ICD-10-CM

## 2023-07-09 LAB — CBC
HCT: 35.5 % — ABNORMAL LOW (ref 36.0–46.0)
Hemoglobin: 12.3 g/dL (ref 12.0–15.0)
MCH: 32 pg (ref 26.0–34.0)
MCHC: 34.6 g/dL (ref 30.0–36.0)
MCV: 92.4 fL (ref 80.0–100.0)
Platelets: 428 10*3/uL — ABNORMAL HIGH (ref 150–400)
RBC: 3.84 MIL/uL — ABNORMAL LOW (ref 3.87–5.11)
RDW: 13.2 % (ref 11.5–15.5)
WBC: 11.5 10*3/uL — ABNORMAL HIGH (ref 4.0–10.5)
nRBC: 0 % (ref 0.0–0.2)

## 2023-07-09 LAB — BLOOD GAS, ARTERIAL
Acid-base deficit: 2 mmol/L (ref 0.0–2.0)
Acid-base deficit: 2.3 mmol/L — ABNORMAL HIGH (ref 0.0–2.0)
Acid-base deficit: 2.6 mmol/L — ABNORMAL HIGH (ref 0.0–2.0)
Bicarbonate: 24.9 mmol/L (ref 20.0–28.0)
Bicarbonate: 25 mmol/L (ref 20.0–28.0)
Bicarbonate: 25.1 mmol/L (ref 20.0–28.0)
Delivery systems: POSITIVE
Expiratory PAP: 5 cm[H2O]
FIO2: 30 %
FIO2: 30 %
Inspiratory PAP: 12 cm[H2O]
MECHVT: 450 mL
Mechanical Rate: 16
O2 Content: 5 L/min
O2 Saturation: 100 %
O2 Saturation: 97.6 %
O2 Saturation: 98.9 %
PEEP: 5 cmH2O
Patient temperature: 37
Patient temperature: 37
Patient temperature: 37
pCO2 arterial: 51 mm[Hg] — ABNORMAL HIGH (ref 32–48)
pCO2 arterial: 52 mm[Hg] — ABNORMAL HIGH (ref 32–48)
pCO2 arterial: 53 mm[Hg] — ABNORMAL HIGH (ref 32–48)
pH, Arterial: 7.28 — ABNORMAL LOW (ref 7.35–7.45)
pH, Arterial: 7.29 — ABNORMAL LOW (ref 7.35–7.45)
pH, Arterial: 7.3 — ABNORMAL LOW (ref 7.35–7.45)
pO2, Arterial: 146 mm[Hg] — ABNORMAL HIGH (ref 83–108)
pO2, Arterial: 80 mm[Hg] — ABNORMAL LOW (ref 83–108)
pO2, Arterial: 95 mm[Hg] (ref 83–108)

## 2023-07-09 LAB — GLUCOSE, CAPILLARY
Glucose-Capillary: 182 mg/dL — ABNORMAL HIGH (ref 70–99)
Glucose-Capillary: 114 mg/dL — ABNORMAL HIGH (ref 70–99)
Glucose-Capillary: 156 mg/dL — ABNORMAL HIGH (ref 70–99)
Glucose-Capillary: 125 mg/dL — ABNORMAL HIGH (ref 70–99)
Glucose-Capillary: 131 mg/dL — ABNORMAL HIGH (ref 70–99)
Glucose-Capillary: 145 mg/dL — ABNORMAL HIGH (ref 70–99)

## 2023-07-09 LAB — URINALYSIS, COMPLETE (UACMP) WITH MICROSCOPIC
Bacteria, UA: NONE SEEN
Bilirubin Urine: NEGATIVE
Glucose, UA: >=500 mg/dL — AB
Ketones, ur: 20 mg/dL — AB
Leukocytes,Ua: NEGATIVE
Nitrite: NEGATIVE
Protein, ur: 100 mg/dL — AB
Specific Gravity, Urine: 1.01 (ref 1.005–1.030)
pH: 5 (ref 5.0–8.0)

## 2023-07-09 LAB — POTASSIUM
Potassium: 5.8 mmol/L — ABNORMAL HIGH (ref 3.5–5.1)
Potassium: 5.3 mmol/L — ABNORMAL HIGH (ref 3.5–5.1)
Potassium: 5 mmol/L (ref 3.5–5.1)

## 2023-07-09 LAB — COMPREHENSIVE METABOLIC PANEL
ALT: 20 U/L (ref 0–44)
AST: 34 U/L (ref 15–41)
Albumin: 3.8 g/dL (ref 3.5–5.0)
Alkaline Phosphatase: 96 U/L (ref 38–126)
Anion gap: 11 (ref 5–15)
BUN: 11 mg/dL (ref 8–23)
CO2: 24 mmol/L (ref 22–32)
Calcium: 9.1 mg/dL (ref 8.9–10.3)
Chloride: 93 mmol/L — ABNORMAL LOW (ref 98–111)
Creatinine, Ser: 0.53 mg/dL (ref 0.44–1.00)
GFR, Estimated: >60 mL/min (ref >60)
Glucose, Bld: 194 mg/dL — ABNORMAL HIGH (ref 70–99)
Potassium: 6.1 mmol/L — ABNORMAL HIGH (ref 3.5–5.1)
Sodium: 128 mmol/L — ABNORMAL LOW (ref 135–145)
Total Bilirubin: 0.9 mg/dL (ref <1.2)
Total Protein: 7.8 g/dL (ref 6.5–8.1)

## 2023-07-09 LAB — HEMOGLOBIN A1C
Hgb A1c MFr Bld: 4.6 % — ABNORMAL LOW (ref 4.8–5.6)
Mean Plasma Glucose: 85.32 mg/dL

## 2023-07-09 LAB — HIV ANTIBODY (ROUTINE TESTING W REFLEX): HIV Screen 4th Generation wRfx: NONREACTIVE

## 2023-07-09 LAB — STREP PNEUMONIAE URINARY ANTIGEN: Strep Pneumo Urinary Antigen: NEGATIVE

## 2023-07-09 LAB — MAGNESIUM: Magnesium: 2.6 mg/dL — ABNORMAL HIGH (ref 1.7–2.4)

## 2023-07-09 LAB — PROCALCITONIN: Procalcitonin: 0.12 ng/mL

## 2023-07-09 LAB — MRSA NEXT GEN BY PCR, NASAL: MRSA by PCR Next Gen: NOT DETECTED

## 2023-07-09 MED ORDER — FOLIC ACID 1 MG PO TABS
1.0000 mg | ORAL_TABLET | Freq: Every day | ORAL | Status: DC
  Administered 2023-07-09 – 2023-07-11 (×3): 1 mg
  Filled 2023-07-09 (×3): qty 1

## 2023-07-09 MED ORDER — KETAMINE HCL 50 MG/5ML IJ SOSY
0.5000 mg/kg | PREFILLED_SYRINGE | Freq: Once | INTRAMUSCULAR | Status: AC
  Administered 2023-07-09: 31 mg via INTRAVENOUS
  Filled 2023-07-09: qty 5

## 2023-07-09 MED ORDER — BUDESONIDE 0.25 MG/2ML IN SUSP
0.2500 mg | Freq: Two times a day (BID) | RESPIRATORY_TRACT | Status: DC
  Administered 2023-07-09: 0.25 mg via RESPIRATORY_TRACT
  Filled 2023-07-09: qty 2

## 2023-07-09 MED ORDER — SODIUM CHLORIDE 0.9 % IV SOLN
2.0000 g | INTRAVENOUS | Status: DC
  Administered 2023-07-09 – 2023-07-11 (×3): 2 g via INTRAVENOUS
  Filled 2023-07-09 (×3): qty 20

## 2023-07-09 MED ORDER — KETAMINE HCL 50 MG/5ML IJ SOSY: 1.5000 mg/kg | PREFILLED_SYRINGE | Freq: Once | INTRAMUSCULAR | Status: DC

## 2023-07-09 MED ORDER — HYDRALAZINE HCL 20 MG/ML IJ SOLN
10.0000 mg | Freq: Four times a day (QID) | INTRAMUSCULAR | Status: DC | PRN
  Administered 2023-07-09: 10 mg via INTRAVENOUS
  Filled 2023-07-09: qty 1

## 2023-07-09 MED ORDER — DEXMEDETOMIDINE HCL IN NACL 400 MCG/100ML IV SOLN
0.0000 ug/kg/h | INTRAVENOUS | Status: DC
  Administered 2023-07-09: 0.2 ug/kg/h via INTRAVENOUS

## 2023-07-09 MED ORDER — FAMOTIDINE 20 MG PO TABS
20.0000 mg | ORAL_TABLET | Freq: Two times a day (BID) | ORAL | Status: DC
  Administered 2023-07-09 – 2023-07-11 (×5): 20 mg
  Filled 2023-07-09 (×5): qty 1

## 2023-07-09 MED ORDER — FENTANYL CITRATE PF 50 MCG/ML IJ SOSY
25.0000 ug | PREFILLED_SYRINGE | INTRAMUSCULAR | Status: DC | PRN
  Administered 2023-07-09 (×2): 50 ug via INTRAVENOUS
  Filled 2023-07-09 (×2): qty 1

## 2023-07-09 MED ORDER — ATORVASTATIN CALCIUM 10 MG PO TABS
10.0000 mg | ORAL_TABLET | Freq: Every morning | ORAL | Status: DC
  Administered 2023-07-09 – 2023-07-11 (×3): 10 mg
  Filled 2023-07-09 (×3): qty 1

## 2023-07-09 MED ORDER — THIAMINE MONONITRATE 100 MG PO TABS: 100.0000 mg | ORAL_TABLET | Freq: Every day | ORAL | Status: DC

## 2023-07-09 MED ORDER — INSULIN ASPART 100 UNIT/ML IV SOLN
10.0000 [IU] | Freq: Once | INTRAVENOUS | Status: AC
  Administered 2023-07-09: 10 [IU] via INTRAVENOUS
  Filled 2023-07-09: qty 0.1

## 2023-07-09 MED ORDER — METHYLPREDNISOLONE SODIUM SUCC 125 MG IJ SOLR: 80.0000 mg | Freq: Two times a day (BID) | INTRAMUSCULAR | Status: DC

## 2023-07-09 MED ORDER — THIAMINE MONONITRATE 100 MG PO TABS
100.0000 mg | ORAL_TABLET | Freq: Every day | ORAL | Status: DC
  Administered 2023-07-09: 100 mg
  Filled 2023-07-09: qty 1

## 2023-07-09 MED ORDER — INSULIN ASPART 100 UNIT/ML IJ SOLN
10.0000 [IU] | Freq: Once | INTRAMUSCULAR | Status: AC
Start: 2023-07-09 — End: 2023-07-09
  Administered 2023-07-09: 10 [IU] via SUBCUTANEOUS
  Filled 2023-07-09: qty 1

## 2023-07-09 MED ORDER — MORPHINE SULFATE (PF) 2 MG/ML IV SOLN
INTRAVENOUS | Status: AC
  Administered 2023-07-09: 2 mg via INTRAVENOUS
  Filled 2023-07-09: qty 1

## 2023-07-09 MED ORDER — FENTANYL CITRATE (PF) 100 MCG/2ML IJ SOLN
100.0000 ug | Freq: Once | INTRAMUSCULAR | Status: AC
  Administered 2023-07-09: 100 ug via INTRAVENOUS
  Filled 2023-07-09: qty 2

## 2023-07-09 MED ORDER — NOREPINEPHRINE 4 MG/250ML-% IV SOLN: 2.0000 ug/min | INTRAVENOUS | Status: DC

## 2023-07-09 MED ORDER — DOCUSATE SODIUM 50 MG/5ML PO LIQD
100.0000 mg | Freq: Two times a day (BID) | ORAL | Status: DC
  Administered 2023-07-09 – 2023-07-11 (×4): 100 mg
  Filled 2023-07-09 (×4): qty 10

## 2023-07-09 MED ORDER — METHYLPREDNISOLONE SODIUM SUCC 125 MG IJ SOLR
125.0000 mg | Freq: Once | INTRAMUSCULAR | Status: AC
  Administered 2023-07-09: 125 mg via INTRAVENOUS
  Filled 2023-07-09: qty 2

## 2023-07-09 MED ORDER — SODIUM ZIRCONIUM CYCLOSILICATE 5 G PO PACK
10.0000 g | PACK | Freq: Once | ORAL | Status: AC
  Administered 2023-07-09: 10 g
  Filled 2023-07-09: qty 2

## 2023-07-09 MED ORDER — LORAZEPAM 2 MG/ML IJ SOLN
1.0000 mg | Freq: Once | INTRAMUSCULAR | Status: AC
  Administered 2023-07-09: 1 mg via INTRAVENOUS
  Filled 2023-07-09: qty 1

## 2023-07-09 MED ORDER — MORPHINE SULFATE (PF) 2 MG/ML IV SOLN
1.0000 mg | INTRAVENOUS | Status: DC | PRN
  Administered 2023-07-09 (×2): 2 mg via INTRAVENOUS
  Filled 2023-07-09 (×2): qty 1

## 2023-07-09 MED ORDER — LACTATED RINGERS IV BOLUS
500.0000 mL | Freq: Once | INTRAVENOUS | Status: AC
  Administered 2023-07-09: 500 mL via INTRAVENOUS

## 2023-07-09 MED ORDER — FREE WATER
30.0000 mL | Status: DC
  Administered 2023-07-09 – 2023-07-11 (×9): 30 mL

## 2023-07-09 MED ORDER — ROCURONIUM BROMIDE 10 MG/ML (PF) SYRINGE
100.0000 mg | PREFILLED_SYRINGE | Freq: Once | INTRAVENOUS | Status: DC
  Filled 2023-07-09: qty 10

## 2023-07-09 MED ORDER — IPRATROPIUM-ALBUTEROL 0.5-2.5 (3) MG/3ML IN SOLN
3.0000 mL | Freq: Once | RESPIRATORY_TRACT | Status: AC
  Administered 2023-07-09: 3 mL via RESPIRATORY_TRACT
  Filled 2023-07-09: qty 3

## 2023-07-09 MED ORDER — FENTANYL 2500MCG IN NS 250ML (10MCG/ML) PREMIX INFUSION
INTRAVENOUS | Status: AC
  Filled 2023-07-09: qty 250

## 2023-07-09 MED ORDER — ADULT MULTIVITAMIN LIQUID CH
15.0000 mL | Freq: Every day | ORAL | Status: DC
  Administered 2023-07-09 – 2023-07-11 (×3): 15 mL
  Filled 2023-07-09 (×3): qty 15

## 2023-07-09 MED ORDER — ORAL CARE MOUTH RINSE: 15.0000 mL | OROMUCOSAL | Status: DC | PRN

## 2023-07-09 MED ORDER — CHLORHEXIDINE GLUCONATE CLOTH 2 % EX PADS
6.0000 | MEDICATED_PAD | Freq: Every day | CUTANEOUS | Status: DC
  Administered 2023-07-09 – 2023-07-10 (×2): 6 via TOPICAL

## 2023-07-09 MED ORDER — PIVOT 1.5 CAL PO LIQD
1000.0000 mL | ORAL | Status: DC
Start: 2023-07-09 — End: 2023-07-09

## 2023-07-09 MED ORDER — MIDAZOLAM HCL 2 MG/2ML IJ SOLN
4.0000 mg | Freq: Once | INTRAMUSCULAR | Status: AC
  Administered 2023-07-09: 4 mg via INTRAVENOUS
  Filled 2023-07-09: qty 4

## 2023-07-09 MED ORDER — DEXTROSE 50 % IV SOLN
1.0000 | Freq: Once | INTRAVENOUS | Status: AC
  Administered 2023-07-09: 50 mL via INTRAVENOUS
  Filled 2023-07-09: qty 50

## 2023-07-09 MED ORDER — FENTANYL CITRATE PF 50 MCG/ML IJ SOSY: 25.0000 ug | PREFILLED_SYRINGE | INTRAMUSCULAR | Status: DC | PRN

## 2023-07-09 MED ORDER — PROPOFOL 1000 MG/100ML IV EMUL
0.0000 ug/kg/min | INTRAVENOUS | Status: DC
  Administered 2023-07-09 – 2023-07-10 (×3): 30 ug/kg/min via INTRAVENOUS
  Administered 2023-07-10: 40 ug/kg/min via INTRAVENOUS
  Administered 2023-07-11: 30 ug/kg/min via INTRAVENOUS
  Administered 2023-07-11: 50 ug/kg/min via INTRAVENOUS
  Filled 2023-07-09 (×7): qty 100

## 2023-07-09 MED ORDER — FENTANYL BOLUS VIA INFUSION
25.0000 ug | INTRAVENOUS | Status: DC | PRN
  Administered 2023-07-09: 25 ug via INTRAVENOUS
  Administered 2023-07-10 – 2023-07-11 (×6): 50 ug via INTRAVENOUS

## 2023-07-09 MED ORDER — ETOMIDATE 2 MG/ML IV SOLN
20.0000 mg | Freq: Once | INTRAVENOUS | Status: AC
  Administered 2023-07-09: 20 mg via INTRAVENOUS
  Filled 2023-07-09: qty 10

## 2023-07-09 MED ORDER — VITAL AF 1.2 CAL PO LIQD
1000.0000 mL | ORAL | Status: DC
  Administered 2023-07-09 – 2023-07-10 (×2): 1000 mL

## 2023-07-09 MED ORDER — METHYLPREDNISOLONE SODIUM SUCC 40 MG IJ SOLR
40.0000 mg | Freq: Two times a day (BID) | INTRAMUSCULAR | Status: DC
Start: 2023-07-09 — End: 2023-07-11
  Administered 2023-07-09 – 2023-07-11 (×4): 40 mg via INTRAVENOUS
  Filled 2023-07-09 (×4): qty 1

## 2023-07-09 MED ORDER — SODIUM CHLORIDE 0.9 % IV BOLUS
1000.0000 mL | Freq: Once | INTRAVENOUS | Status: AC
  Administered 2023-07-09: 1000 mL via INTRAVENOUS

## 2023-07-09 MED ORDER — FENTANYL 2500MCG IN NS 250ML (10MCG/ML) PREMIX INFUSION
25.0000 ug/h | INTRAVENOUS | Status: DC
  Administered 2023-07-09: 25 ug/h via INTRAVENOUS
  Filled 2023-07-09: qty 250

## 2023-07-09 MED ORDER — THIAMINE HCL 100 MG/ML IJ SOLN
500.0000 mg | INTRAVENOUS | Status: DC
  Administered 2023-07-10 – 2023-07-11 (×2): 500 mg via INTRAVENOUS
  Filled 2023-07-09 (×3): qty 5

## 2023-07-09 MED ORDER — MORPHINE SULFATE (PF) 2 MG/ML IV SOLN: 2.0000 mg | Freq: Once | INTRAVENOUS | Status: AC

## 2023-07-09 MED ORDER — ORAL CARE MOUTH RINSE
15.0000 mL | OROMUCOSAL | Status: DC
  Administered 2023-07-09 – 2023-07-14 (×54): 15 mL via OROMUCOSAL

## 2023-07-09 MED ORDER — SODIUM CHLORIDE 0.9 % IV SOLN
500.0000 mg | INTRAVENOUS | Status: DC
  Filled 2023-07-09: qty 5

## 2023-07-09 MED ORDER — DOXYCYCLINE HYCLATE 100 MG PO TABS
100.0000 mg | ORAL_TABLET | Freq: Two times a day (BID) | ORAL | Status: DC
  Administered 2023-07-09: 100 mg
  Filled 2023-07-09: qty 1

## 2023-07-09 MED ORDER — IPRATROPIUM-ALBUTEROL 0.5-2.5 (3) MG/3ML IN SOLN
3.0000 mL | RESPIRATORY_TRACT | Status: DC
  Administered 2023-07-09 – 2023-07-11 (×16): 3 mL via RESPIRATORY_TRACT
  Filled 2023-07-09 (×17): qty 3

## 2023-07-09 MED ORDER — BUDESONIDE 0.5 MG/2ML IN SUSP
0.5000 mg | Freq: Two times a day (BID) | RESPIRATORY_TRACT | Status: DC
  Administered 2023-07-09 – 2023-07-11 (×4): 0.5 mg via RESPIRATORY_TRACT
  Filled 2023-07-09 (×5): qty 2

## 2023-07-09 MED ORDER — AZITHROMYCIN 250 MG PO TABS
500.0000 mg | ORAL_TABLET | Freq: Every day | ORAL | Status: DC
  Administered 2023-07-09 – 2023-07-11 (×3): 500 mg
  Filled 2023-07-09 (×2): qty 2

## 2023-07-09 MED ORDER — BUSPIRONE HCL 10 MG PO TABS
15.0000 mg | ORAL_TABLET | Freq: Two times a day (BID) | ORAL | Status: DC
  Administered 2023-07-09 – 2023-07-11 (×5): 15 mg
  Filled 2023-07-09 (×5): qty 2

## 2023-07-09 MED ORDER — MIDAZOLAM HCL 2 MG/2ML IJ SOLN
1.0000 mg | INTRAMUSCULAR | Status: DC | PRN
  Administered 2023-07-09 – 2023-07-11 (×4): 2 mg via INTRAVENOUS
  Filled 2023-07-09 (×4): qty 2

## 2023-07-09 MED ORDER — SODIUM CHLORIDE 0.9 % IV SOLN
250.0000 mL | INTRAVENOUS | Status: AC
  Administered 2023-07-09: 250 mL via INTRAVENOUS

## 2023-07-09 MED ORDER — INSULIN ASPART 100 UNIT/ML IJ SOLN
0.0000 [IU] | INTRAMUSCULAR | Status: DC
  Administered 2023-07-09: 3 [IU] via SUBCUTANEOUS
  Administered 2023-07-09 – 2023-07-10 (×5): 2 [IU] via SUBCUTANEOUS
  Administered 2023-07-10 (×3): 3 [IU] via SUBCUTANEOUS
  Administered 2023-07-10: 2 [IU] via SUBCUTANEOUS
  Administered 2023-07-11: 3 [IU] via SUBCUTANEOUS
  Filled 2023-07-09 (×9): qty 1

## 2023-07-09 MED ORDER — POLYETHYLENE GLYCOL 3350 17 G PO PACK: 17.0000 g | PACK | Freq: Every day | ORAL | Status: DC

## 2023-07-09 NOTE — Progress Notes (Signed)
Pt was aggitated attempting to pull off medical devices.Morphine IV push given.I. Pt was stiill aggitated . Intubated urgently and Placed on vent.V/s stable.Started Precedex drip. Will continue to observe closely.

## 2023-07-09 NOTE — Progress Notes (Signed)
Initial Nutrition Assessment  DOCUMENTATION CODES:   Not applicable  INTERVENTION:   -TF via OGT:   Initiate Vital AF 1.2 @ 20 ml/hr and increase by 10 ml every 4 hours to goal rate of 50 ml/hr.   30 ml free water flush every 4 hours to maintain tube patency  Tube feeding regimen provides 1440 kcal (100% of needs), 90 grams of protein, and 973 ml of H2O. Total free water: 1153 ml daily  TF + propofol to provide 1746 kcals daily  NUTRITION DIAGNOSIS:   Inadequate oral intake related to inability to eat as evidenced by NPO status.  GOAL:   Patient will meet greater than or equal to 90% of their needs  MONITOR:   TF tolerance, Vent status  REASON FOR ASSESSMENT:   Consult Assessment of nutrition requirement/status  ASSESSMENT:   Pt presented for evaluation of worsening shortness of breath. PMH includes HTN, hypercholesterolemia, and COPD.  Pt admitted with COPD exacerbation.   12/4- rapid response called due to worsening shortness of breath, transferred to ICU on Bi-pap, intubated  Patient is currently intubated on ventilator support. OGT placement confirmed; per KUB on 12/4 tip of tube and side port in gastric body.  MV: 8.7 L/min Temp (24hrs), Avg:98.1 F (36.7 C), Min:97.7 F (36.5 C), Max:98.6 F (37 C)  Propofol: 5.8 ml/hr (provides 306 kcals daily)  Reviewed I/O's: -60 ml x 24 hours  UOP: 300 ml x 24 hours  Per H&P, pt with yellowish sputum production and increased wheezing and shortness of breath for 3 days PTA. Pt was intubated early this morning due to inability to tolerate bi-pap.   No family available to provide additional history at time of visit.   Case discussed with RN, MD, and NP.  Received permission to start TF today.   Reviewed wt hx; no wt loss noted over the past 16 months.   Medications reviewed and include pulmicort, colace, lovenox, solu-medrol, miralax, precedex, and thiamine.   No results found for: "HGBA1C" PTA DM medications  are none.   Labs reviewed: Na: 128, K: 5.3, Mg: 2.6, CBGS: 114-156 (inpatient orders for glycemic control are 0-15 units insulin aspart every 4 hours).    NUTRITION - FOCUSED PHYSICAL EXAM:  Flowsheet Row Most Recent Value  Orbital Region Mild depletion  Upper Arm Region No depletion  Thoracic and Lumbar Region No depletion  Buccal Region No depletion  Temple Region Mild depletion  Clavicle Bone Region No depletion  Clavicle and Acromion Bone Region No depletion  Scapular Bone Region No depletion  Dorsal Hand No depletion  Patellar Region Mild depletion  Anterior Thigh Region Mild depletion  Posterior Calf Region Mild depletion  Edema (RD Assessment) None  Hair Reviewed  Eyes Reviewed  Mouth Reviewed  Skin Reviewed  Nails Reviewed       Diet Order:   Diet Order             Diet NPO time specified  Diet effective now                   EDUCATION NEEDS:   Not appropriate for education at this time  Skin:  Skin Assessment: Reviewed RN Assessment  Last BM:  07/09/23 (type 5)  Height:   Ht Readings from Last 1 Encounters:  07/09/23 5\' 6"  (1.676 m)    Weight:   Wt Readings from Last 1 Encounters:  07/09/23 64.8 kg    Ideal Body Weight:  59.1 kg  BMI:  Body  mass index is 23.06 kg/m.  Estimated Nutritional Needs:   Kcal:  1402  Protein:  85-100 grams  Fluid:  1.4-1.6 L    Levada Schilling, RD, LDN, CDCES Registered Dietitian III Certified Diabetes Care and Education Specialist Please refer to [facility] for RD and/or RD on-call/weekend/after hours pager

## 2023-07-09 NOTE — Progress Notes (Signed)
Pt's husband and son updated at bedside on plan of care.  All questions answered.  They are appreciative of update.    Harlon Ditty, AGACNP-BC [facility] Prefer epic messenger for cross cover needs If after hours, please call E-link

## 2023-07-09 NOTE — Evaluation (Signed)
OT Cancellation Note  Patient Details Name: KLOIE MASRI MRN: [id number] DOB: [birth year]   Cancelled Treatment:    Reason Eval/Treat Not Completed: Patient not medically ready. Orders received, chart reviewed. Pt intubated urgently this AM. OT will hold at this time and await new OT orders when medically stable.   Rockne Dearinger L. Cammy Sanjurjo, OTR/L  07/09/23, 8:28 AM

## 2023-07-09 NOTE — Progress Notes (Addendum)
Acute Hypoxic Respiratory Failure secondary to AECOPD Patient agitated, repeatedly attempting to pull off BIPAP, tachycardic/ tachypneic and unable to follow commands- attempted IVP morphine. Patient still unable to tolerate BIPAP. Discussed with husband bedside, decision made to urgently intubate and place the patient on mechanical ventilatory support. - Ventilator settings: PRVC  8 mL/kg, 40% FiO2, 5 PEEP, continue ventilator support & lung protective strategies - Wean PEEP & FiO2 as tolerated, maintain SpO2 > 88% - Head of bed elevated 30 degrees, VAP protocol in place - Plateau pressures less than 30 cm H20  - Intermittent chest x-ray & ABG PRN - Daily WUA with SBT as tolerated  - Ensure adequate pulmonary hygiene  - F/u cultures, trend PCT - Continue Doxycycline - Solu-medrol 80 mg BID  - Budesonide nebs BID, Duo nebs Q 4, bronchodilators PRN - PAD protocol in place: continue Fentanyl IVP & Precedex drip with Versed IVP  ETOH use Husband reports that she has abstained since Fri 11/30, but does drink 2-6 beers daily. - thiamine, multivitamin, folic acid daily - utilize CIWA after extubation if needed - consider librium/phenobarbital PRN   Cheryll Cockayne Rust-Chester, AGACNP-BC Acute Care Nurse Practitioner [facility]   [phone] / [phone] Please see Amion for details.

## 2023-07-09 NOTE — Progress Notes (Signed)
Pt transported to ICU on Bipap without incident. Pt remains on Bipap  Report given to ICU RT.

## 2023-07-09 NOTE — Plan of Care (Signed)
  Problem: Clinical Measurements: Goal: Ability to maintain clinical measurements within normal limits will improve Outcome: Progressing Goal: Will remain free from infection Outcome: Progressing Goal: Diagnostic test results will improve Outcome: Progressing Goal: Respiratory complications will improve Outcome: Progressing   Problem: Activity: Goal: Risk for activity intolerance will decrease Outcome: Progressing   Problem: Nutrition: Goal: Adequate nutrition will be maintained Outcome: Not Met (add Reason)   Problem: Clinical Measurements: Goal: Respiratory complications will improve Outcome: Progressing   Problem: Clinical Measurements: Goal: Diagnostic test results will improve Outcome: Progressing   Problem: Clinical Measurements: Goal: Will remain free from infection Outcome: Progressing   Problem: Nutrition: Goal: Adequate nutrition will be maintained Outcome: Not Met (add Reason)   Problem: Coping: Goal: Level of anxiety will decrease Outcome: Progressing

## 2023-07-09 NOTE — Procedures (Signed)
Intubation Procedure Note  Emily Bishop  [id number]  06-09-1956  Date:07/09/23  Time:6:40 AM   Provider Performing:Lamerle Jabs L Rust-Chester    Procedure: Intubation (31500)  Indication(s) Respiratory Failure  Consent Risks of the procedure as well as the alternatives and risks of each were explained to the patient and/or caregiver.  Consent for the procedure was obtained and is signed in the bedside chart   Anesthesia Etomidate, Versed, Fentanyl, and Rocuronium   Time Out Verified patient identification, verified procedure, site/side was marked, verified correct patient position, special equipment/implants available, medications/allergies/relevant history reviewed, required imaging and test results available.   Sterile Technique Usual hand hygeine, masks, and gloves were used   Procedure Description Patient positioned in bed supine.  Sedation given as noted above.  Patient was intubated with endotracheal tube using Glidescope.  View was Grade 1 full glottis .  Number of attempts was 1.  Colorimetric CO2 detector was consistent with tracheal placement.   Complications/Tolerance None; patient tolerated the procedure well. Chest X-ray is ordered to verify placement.   EBL Minimal   Specimen(s) None   Betsey Holiday, AGACNP-BC Acute Care Nurse Practitioner [place] [facility]   [phone] / [phone] Please see Amion for details.

## 2023-07-09 NOTE — Consult Note (Signed)
NAME:  Emily Bishop, MRN:  [id number], DOB:  Dec 23, 1955, LOS: 1 ADMISSION DATE:  07/08/2023, CONSULTATION DATE:  07/09/23 REFERRING MD:  Randye Lobo, NP, CHIEF COMPLAINT: Shortness of breath  History of Present Illness:  67 year old female presenting to [facility] ED from home via EMS on 07/08/2023 for evaluation of worsening shortness of breath.  History provided per chart review and spouse bedside report as patient is unable to participate in interview at this time. Patient was in her normal state of health until roughly 3 days ago on 07/05/2023 when she developed some sinus congestion and shortness of breath.  Over these 3 days she has had progressive dyspnea, wheezing and spouse reports dry nonproductive cough.  Of note patient reported on arrival a productive cough with yellowish sputum. She denied chest pain on arrival, as well as denying nausea/ vomiting/ diarrhea/ abdominal pain.  Spouse denies any fever or chills stating he checked her temperature and she was about 99 degrees.  She also reported using her albuterol rescue inhaler around-the-clock with minimal help. They do not have the capability to check her SpO2 at home and the patient does not utilize home oxygen during the day or at night.  Spouse reports her baseline as " lying on the couch".  When asked if she is able to take part in activity without shortness of breath he responded that he takes care of all the cleaning and cooking and that she does not have to do any activities. He reports that her PO diet has remained good and she has been eating and drinking well. He also reported at baseline she sleeps sitting up in a recliner chair. He states she is a former cigarette smoker, switching to vaping 3-4 years ago after she was diagnosed with COPD.  He is unclear of how much vaping the patient does during the day.  She has a history of EtOH abuse, however has reported cutting back and has been confirms she has 2-6 beers a day but has abstained  since Friday, 07/05/2023 due to not feeling well.  He denies any withdrawal symptoms in the past when she has abstained from drinking EtOH.  He denies any recreational drug use. She was last seen by pulmonology outpatient in August 2023 at that time she was evaluated for home oxygen and was not a candidate.  EMS administered 2 DuoNebs and Solu-Medrol for respiratory distress. ED course: Upon arrival patient alert and awake diminished breath sounds and expiratory wheezing.  SpO2 low to mid 90s on room air, temperature 99.6 with tachycardia and hypertension. COVID/flu/RSV negative, chest x-ray negative for acute infiltrate.  Labs significant for hypokalemia, NAGMA, hypocalcemia & hypoalbuminemia. TRH consulted for admission to inpatient.  Significant labs: (Labs/ Imaging personally reviewed) I, Cheryll Cockayne Rust-Chester, AGACNP-BC, personally viewed and interpreted this ECG. EKG Interpretation: Date: 07/08/2023, EKG Time: 12:15, Rate: 115, Rhythm: ST, QRS Axis: Normal, Intervals: Normal, ST/T Wave abnormalities: None, Narrative Interpretation: Sinus tachycardia Chemistry: Na+: 136 > 127 > 128, K+: 2.1 > 5.5 > 6.1, BUN/Cr.:  11/0.53, Serum CO2/ AG: 13/10 > 24/11 Albumin: 2.2 > 3.8 Hematology: WBC: 7.8 > 11.5, Hgb: 12.3,  PCT: Pending, COVID-19 & Influenza A/B: Negative Mycoplasma pneumonia: Pending Legionella: Pending  ABG: 7.30/ 51/ 146/ 25.1 CXR 07/09/23: Emphysematous and chronic bronchitic changes in the lungs no evidence of active pulmonary disease  Hospital course: Overnight patient with worsening shortness of breath, tripoding, anxious in respiratory distress.  She was given several DuoNeb treatments and Ativan, placed on BiPAP  for support and transferred to the ICU. PCCM consulted for assistance in management and monitoring due to acute hypoxic respiratory failure secondary to AECOPD with a high risk for intubation and mechanical ventilatory support.  Pertinent  Medical History  Former  tobacco use Current everyday vaping EtOH abuse COPD Hypertension Hypercholesterolemia Anxiety Significant Hospital Events: Including procedures, antibiotic start and stop dates in addition to other pertinent events   07/08/2023: Admit to inpatient with AECOPD by TRH, bronchodilators and doxycycline initiated.  Overnight patient with worsening respiratory distress and acute hypoxic respiratory failure secondary to AECOPD with high risk for intubation and mechanical ventilatory support placed on BiPAP-PCCM consulted.  Interim History / Subjective:  Patient unable to participate in interview, agitated in respiratory distress on BiPAP support.  Lung sounds extremely diminished with faint diffuse wheezing auscultated.  Discussed potential need for emergent intubation and mechanical ventilatory support with spouse who requested we try any medication we could first including sedatives to see if her clinical status improved. We discussed utilizing ketamine IV as a bolus, making him aware if it made her too sleepy we would have to intubate her and place her on mechanical ventilatory support.  He is in agreement with plan.  Objective   Blood pressure (!) 185/97, pulse (!) 110, temperature 98.6 F (37 C), temperature source Oral, resp. rate (!) 22, height 5\' 6"  (1.676 m), weight 61 kg, SpO2 100%.    FiO2 (%):  [30 %-40 %] 30 %   Intake/Output Summary (Last 24 hours) at 07/09/2023 0244 Last data filed at 07/09/2023 0100 Gross per 24 hour  Intake 240 ml  Output 300 ml  Net -60 ml   Filed Weights   07/08/23 1138  Weight: 61 kg    Examination: General: Adult female, critically ill, agitated in respiratory distress on BiPAP HEENT: MM pink/moist, anicteric, atraumatic, neck supple Neuro: RASS +1, unable to follow commands, PERRL +3, MAE CV: s1s2 RRR, ST on monitor, no r/m/g Pulm: Tachypneic, labored on BiPAP at 30%, breath sounds very diminished with diffuse wheezing throughout GI: soft, rounded,  non tender, bs x 4 GU: foley in place with clear yellow urine Skin: Scattered ecchymosis, no rashes/lesions noted Extremities: warm/dry, pulses + 2 R/P, no edema noted  Resolved Hospital Problem list     Assessment & Plan:  Acute Hypoxic Respiratory Failure secondary to AECOPD PMHx: End-stage COPD, tobacco use, pulmonary nodules - will try 0.5 mg/kg of ketamine IV bolus, discussed with husband that this might make the patient so sleepy she will require intubation afterwards - Continue BIPAP overnight, wean FiO2 as tolerated > HIGH RISK for INTUBATION - Supplemental O2 to maintain SpO2 > 88% - Intermittent chest x-ray & ABG PRN - Ensure adequate pulmonary hygiene  - F/u cultures, trend PCT - Continue doxycycline & Mucinex - morphine PRN for WOB/ air hunger - Yupelri neb daily, add Pulmicort neb BID, increase scheduled Duo nebs to Q 4 h, bronchodilators PRN  - Methylprednisolone IV 80 mg Q 12 - Trend PCT, monitor WBC/ fever curve - Vaping cessation education once stabilized - Appropriate vaccinations prior to discharge PRN  Hypokalemic now hyperkalemic post supplementation Hyponatremia Hypocalcemia- now resolved NAGMA- now resolved Electrolytes on follow-up chemistries very different from original chemistry.  Initially normal sodium, then hyponatremic which is slowly improving.  Initially hypokalemic now hyperkalemic.  Patient received 50 milequivalents of potassium supplementation on admission. -Shifting measures ordered: Insulin & D50, consider Lokelma once able to tolerate p.o. medication -Follow-up potassium at 6 AM  Hypertension -  continue outpatient regimen of losartan as long as tolerating PO medications - hydralazine IV PRN  for SBP > 160  Anxiety - continue Trazodone at bedtime PRN & Buspar as long as patient is able to tolerate PO medication - morphine IV PRN for increased WOB/ agitation and air hunger  GOC Consider palliative care consultation due to diagnosis of  End-Stage COPD noted in most recent Pulmonology Note from 03/2022.  Best Practice (right click and "Reselect all SmartList Selections" daily)  Diet/type: NPO, while on BiPAP support DVT prophylaxis: enoxaparin (LOVENOX) injection 40 mg Start: 07/08/23 2200  Pressure ulcer(s): not present on admission  GI prophylaxis: N/A Lines: N/A Foley:  N/A Code Status:  full code Last date of multidisciplinary goals of care discussion [07/09/23] 07/09/23: We discussed the patient's COPD diagnosis at length bedside as well as her HIGH RISK for INTUBATION and mechanical ventilatory support.  We discussed that she is at a higher risk of long-term mechanical ventilatory support due to her severe COPD. We discussed using ketamine IV bolus first to try to avoid mechanical ventilation.  Labs   CBC: Recent Labs  Lab 07/08/23 1146 07/09/23 0229  WBC 7.8 11.5*  HGB 11.6* 12.3  HCT 33.2* 35.5*  MCV 94.6 92.4  PLT 253 428*    Basic Metabolic Panel: Recent Labs  Lab 07/08/23 1146 07/08/23 2130  NA 136 127*  K 2.1* 5.5*  CL 113* 92*  CO2 13* 24  GLUCOSE 89 184*  BUN <5* 9  CREATININE 0.34* 0.63  CALCIUM 5.5* 9.2  MG  --  2.6*  PHOS  --  3.5   GFR: Estimated Creatinine Clearance: 65.6 mL/min (by C-G formula based on SCr of 0.63 mg/dL). Recent Labs  Lab 07/08/23 1146 07/09/23 0229  WBC 7.8 11.5*    Liver Function Tests: Recent Labs  Lab 07/08/23 1146  AST 12*  ALT 10  ALKPHOS 52  BILITOT 0.6  PROT 4.2*  ALBUMIN 2.2*   No results for input(s): "LIPASE", "AMYLASE" in the last 168 hours. No results for input(s): "AMMONIA" in the last 168 hours.  ABG    Component Value Date/Time   PHART 7.3 (L) 07/09/2023 0015   PCO2ART 51 (H) 07/09/2023 0015   PO2ART 146 (H) 07/09/2023 0015   HCO3 25.1 07/09/2023 0015   ACIDBASEDEF 2.0 07/09/2023 0015   O2SAT 100 07/09/2023 0015     Coagulation Profile: No results for input(s): "INR", "PROTIME" in the last 168 hours.  Cardiac Enzymes: No  results for input(s): "CKTOTAL", "CKMB", "CKMBINDEX", "TROPONINI" in the last 168 hours.  HbA1C: No results found for: "HGBA1C"  CBG: Recent Labs  Lab 07/09/23 0215  GLUCAP 182*    Review of Systems:   UTA- patient in respiratory distress, unable to participate in interview at this time  Past Medical History:  She,  has a past medical history of COPD exacerbation (HCC) (05/24/2021), History of blood transfusion, Hypercholesteremia, and Hypertension.   Surgical History:   Past Surgical History:  Procedure Laterality Date   EXTERNAL FIXATION REMOVAL Left 03/20/2018   Procedure: REMOVAL EXTERNAL FIXATION  LEFT LEG;  Surgeon: Myrene Galas, MD;  Location: [facility] OR;  Service: Orthopedics;  Laterality: Left;   EYE SURGERY Left    retina occulsion   ORIF ANKLE FRACTURE Left 02/08/2018   Procedure: external fixation left ankle;  Surgeon: Signa Kell, MD;  Location: [facility] ORS;  Service: Orthopedics;  Laterality: Left;     Social History:   reports that she quit smoking about 2  years ago. Her smoking use included cigarettes and e-cigarettes. She started smoking about 22 years ago. She has a 10 pack-year smoking history. She has been exposed to tobacco smoke. She has never used smokeless tobacco. She reports that she does not currently use alcohol after a past usage of about 28.0 standard drinks of alcohol per week. She reports that she does not use drugs.   Family History:  Her family history is not on file.   Allergies Allergies  Allergen Reactions   Penicillins Anaphylaxis, Swelling and Other (See Comments)    Tongue swells  Has patient had a PCN reaction causing immediate rash, facial/tongue/throat swelling, SOB or lightheadedness with hypotension: Y Has patient had a PCN reaction causing severe rash involving mucus membranes or skin necrosis: Unknown Has patient had a PCN reaction that required hospitalization: No Has patient had a PCN reaction occurring within the last 10 years:  No If all of the above answers are "NO", then may proceed with Cephalosporin use.      Home Medications  Prior to Admission medications   Medication Sig Start Date End Date Taking? Authorizing Provider  albuterol (PROVENTIL) (2.5 MG/3ML) 0.083% nebulizer solution Take 3 mLs (2.5 mg total) by nebulization every 4 (four) hours as needed. 06/12/21  Yes Nyoka Cowden, MD  albuterol (VENTOLIN HFA) 108 (90 Base) MCG/ACT inhaler Inhale 2 puffs into the lungs 6 (six) times daily. Patient taking differently: Inhale 2 puffs into the lungs every 6 (six) hours as needed for shortness of breath or wheezing. 05/26/21  Yes Rai, Ripudeep K, MD  atorvastatin (LIPITOR) 10 MG tablet Take 10 mg by mouth in the morning. 01/28/18  Yes [provider]  busPIRone (BUSPAR) 15 MG tablet Take 15 mg by mouth in the morning. 04/10/21  Yes [provider]  escitalopram (LEXAPRO) 10 MG tablet Take 10 mg by mouth at bedtime. 05/23/23  Yes [provider]  fluticasone (FLONASE) 50 MCG/ACT nasal spray Place 2 sprays into both nostrils daily. Patient taking differently: Place 2 sprays into both nostrils daily as needed for rhinitis or allergies. 05/26/21  Yes Rai, Ripudeep K, MD  ipratropium-albuterol (DUONEB) 0.5-2.5 (3) MG/3ML SOLN Take 3 mLs by nebulization every 6 (six) hours as needed. 03/19/22  Yes Nyoka Cowden, MD  losartan (COZAAR) 50 MG tablet Take 50 mg by mouth in the morning. 02/01/18  Yes [provider]  Multiple Vitamin (MULTIVITAMIN) tablet Take 1 tablet by mouth daily.   Yes [provider]  traZODone (DESYREL) 50 MG tablet Take 50 mg by mouth at bedtime as needed for sleep. 03/16/21  Yes [provider]  BREZTRI AEROSPHERE 160-9-4.8 MCG/ACT AERO Inhale 2 puffs into the lungs 2 (two) times daily. Patient not taking: Reported on 07/08/2023 06/24/23   [provider]     Critical care time: 68 minutes       Betsey Holiday,  AGACNP-BC Acute Care Nurse Practitioner [facility]   [phone] / [phone] Please see Amion for details.

## 2023-07-09 NOTE — Progress Notes (Signed)
       CROSS COVER NOTE  NAME: Emily Bishop MRN: [id number] DOB : [birth year]    Date of Service   07/09/2023   HPI/Events of Note   Rapid response nurse called to report that patient was having worsening shortness of breath and was tripoding in the bed and appears very anxious and struggling to breathe. Chart review showed that patient was admitted for severe COPD exacerbation.  Patient was given several DuoNeb treatments.  Patient also has a history of anxiety and takes anxiolytics at home. On assessment at bedside, patient able to speak in complete sentences but struggles.  Patient tripoding on the edge of the bed.  Auscultations of lungs reveal some wheezing with diminished lung sounds.  Patient was tachycardic, tachypneic and hypertensive.  Interventions    Patient was given some lorazepam to help with anxiety and given an additional DuoNeb treatment. Patient's condition did not improve after breathing treatment and lorazepam. Blood work sent for electrolytes as patient was initially hypokalemic. New chest x-ray obtained to rule out other possible causes for the shortness of breath. Patient transferred to stepdown.  ICU engaged: Plan for intubation if patient deteriorates.       Emily Merida Lamin Geradine Girt, MSN, APRN, AGACNP-BC [facility]  Pager: [phone]. Check Amion for Availability

## 2023-07-09 NOTE — Progress Notes (Addendum)
The patient arrived to the unit with a yellow MEWS, presenting with tachycardia and tachypnea. The Rapid Response RN and the on-call hospitalist were notified and promptly assessed the patient at the bedside. Despite interventions, the patient remained in a yellow MEWS; however, her level of distress showed improvement. Given the patient's high acuity and the need for closer monitoring, the hospitalist ordered a transfer to the ICU for continued observation and management  07/08/23 2029  Assess: MEWS Score  Temp 98.6 F (37 C)  BP (!) 185/97 (PRN hydralazine given)  MAP (mmHg) 124  Pulse Rate (!) 111  Resp (!) 22  Level of Consciousness Alert  SpO2 100 %  O2 Device Nasal Cannula  Patient Activity (if Appropriate) In bed  O2 Flow Rate (L/min) 5 L/min  Assess: MEWS Score  MEWS Temp 0  MEWS Systolic 0  MEWS Pulse 2  MEWS RR 1  MEWS LOC 0  MEWS Score 3  MEWS Score Color Yellow  Assess: if the MEWS score is Yellow or Red  Were vital signs accurate and taken at a resting state? Yes  Does the patient meet 2 or more of the SIRS criteria? Yes  Does the patient have a confirmed or suspected source of infection? Yes  MEWS guidelines implemented  No, previously red, continue vital signs every 4 hours  Notify: Charge Nurse/RN  Name of Charge Nurse/RN Notified Jody  Provider Notification  Provider Name/Title Modou Jawo/ NP  Date Provider Notified 07/08/23  Time Provider Notified 2200  Method of Notification Page  Notification Reason Other (Comment) (Yellow MEWS)  Provider response See new orders;En route  Date of Provider Response 07/08/23  Time of Provider Response 2015  Notify: Rapid Response  Name of Rapid Response RN Notified Byrd Hesselbach, RN  Date Rapid Response Notified 07/08/23  Time Rapid Response Notified 2340  Assess: SIRS CRITERIA  SIRS Temperature  0  SIRS Pulse 1  SIRS Respirations  1  SIRS WBC 0  SIRS Score Sum  2

## 2023-07-09 NOTE — Progress Notes (Signed)
PT Cancellation Note  Patient Details Name: Emily Bishop MRN: [id number] DOB: 07-Apr-1956   Cancelled Treatment:    Reason Eval/Treat Not Completed: Medical issues which prohibited therapy (Patient has been urgently intubated since PT orders placed. Please re-order when appropriate.)  Donna Bernard, PT, MPT  [name] 07/09/2023, 10:36 AM

## 2023-07-09 NOTE — Progress Notes (Signed)
2355 Called to bedside by [facility] charge Nurse Patient Tripoding at the side of the bed and unable to talk in complete sentences. Jawo NP notified of patients breathing and Vitals BP 195/110 HR 120s O2 100% on 5L RR 35-40. Husband at bed side expressing that patient is really anxious at bedside and her current breathing is similar to what brought her to the ED.   Order for 1mg  IV Ativan and ABG  0024 Patient able to speak in full sentences now but Patient wheezing through out lungs bilaterally.  NP Jawo at bedside Orders placed for Douneb to be given now and an order for 1 MG of Ativan to be given if patients anxiety does not get any better in 15 mins. Vitals BP 160/98 HR 131 O2 99 on 5L RR 40 communicated to NP.  0050 NP Jawo notified Patient still wheezing/SOB becoming more restless after Douneb and 2nd dose of 1mg  IV Ativan. Order for Bipap and Soul-Medrol placed.  0115 NP Jawo Made aware of Patients vitals on Bipap BP 116/118 HR 140 RR 30 O2 100% on 30% bipap. Patient less restless and tolorating Bipap Order for EKG placed and transfer for PCU.   0132 Patient increasingly agitated patient pulling at cords, bipap and unable to get EKG. Due to patients breathing and current mental status suggestion for SDU over PCU made. NP Jawo at Bedside and level of care changed to SDU. Awaiting Respiratory to transfer. Chest Xray at bedside.   4098 Patient Arrived to SDU patient fighting the bipap and HR 147. NP called to bedside  0230  NP Geradine Girt and NP Rust-Chester at bedside.

## 2023-07-10 ENCOUNTER — Inpatient Hospital Stay: Payer: PPO

## 2023-07-10 ENCOUNTER — Other Ambulatory Visit: Payer: Self-pay

## 2023-07-10 LAB — CBC
HCT: 31.9 % — ABNORMAL LOW (ref 36.0–46.0)
Hemoglobin: 10.6 g/dL — ABNORMAL LOW (ref 12.0–15.0)
MCH: 32.2 pg (ref 26.0–34.0)
MCHC: 33.2 g/dL (ref 30.0–36.0)
MCV: 97 fL (ref 80.0–100.0)
Platelets: 319 10*3/uL (ref 150–400)
RBC: 3.29 MIL/uL — ABNORMAL LOW (ref 3.87–5.11)
RDW: 13.6 % (ref 11.5–15.5)
WBC: 12.3 10*3/uL — ABNORMAL HIGH (ref 4.0–10.5)
nRBC: 0 % (ref 0.0–0.2)

## 2023-07-10 LAB — CULTURE, RESPIRATORY W GRAM STAIN

## 2023-07-10 LAB — RENAL FUNCTION PANEL
Albumin: 3.2 g/dL — ABNORMAL LOW (ref 3.5–5.0)
Anion gap: 8 (ref 5–15)
BUN: 22 mg/dL (ref 8–23)
CO2: 24 mmol/L (ref 22–32)
Calcium: 8.9 mg/dL (ref 8.9–10.3)
Chloride: 97 mmol/L — ABNORMAL LOW (ref 98–111)
Creatinine, Ser: 0.72 mg/dL (ref 0.44–1.00)
GFR, Estimated: >60 mL/min (ref >60)
Glucose, Bld: 185 mg/dL — ABNORMAL HIGH (ref 70–99)
Phosphorus: 4 mg/dL (ref 2.5–4.6)
Potassium: 4.6 mmol/L (ref 3.5–5.1)
Sodium: 129 mmol/L — ABNORMAL LOW (ref 135–145)

## 2023-07-10 LAB — GLUCOSE, CAPILLARY
Glucose-Capillary: 126 mg/dL — ABNORMAL HIGH (ref 70–99)
Glucose-Capillary: 135 mg/dL — ABNORMAL HIGH (ref 70–99)
Glucose-Capillary: 150 mg/dL — ABNORMAL HIGH (ref 70–99)
Glucose-Capillary: 161 mg/dL — ABNORMAL HIGH (ref 70–99)
Glucose-Capillary: 176 mg/dL — ABNORMAL HIGH (ref 70–99)
Glucose-Capillary: 177 mg/dL — ABNORMAL HIGH (ref 70–99)

## 2023-07-10 LAB — RESPIRATORY PANEL BY PCR

## 2023-07-10 LAB — MAGNESIUM: Magnesium: 2.7 mg/dL — ABNORMAL HIGH (ref 1.7–2.4)

## 2023-07-10 LAB — LEGIONELLA PNEUMOPHILA SEROGP 1 UR AG: L. pneumophila Serogp 1 Ur Ag: NEGATIVE

## 2023-07-10 LAB — MYCOPLASMA PNEUMONIAE ANTIBODY, IGM: Mycoplasma pneumo IgM: <770 U/mL (ref 0–769)

## 2023-07-10 LAB — TRIGLYCERIDES: Triglycerides: 149 mg/dL (ref <150)

## 2023-07-10 LAB — PROCALCITONIN: Procalcitonin: 0.1 ng/mL

## 2023-07-10 MED ORDER — VITAMIN C 500 MG PO TABS
500.0000 mg | ORAL_TABLET | Freq: Two times a day (BID) | ORAL | Status: DC
  Administered 2023-07-10 – 2023-07-11 (×2): 500 mg
  Filled 2023-07-10 (×2): qty 1

## 2023-07-10 MED ORDER — QUETIAPINE FUMARATE 25 MG PO TABS
25.0000 mg | ORAL_TABLET | Freq: Two times a day (BID) | ORAL | Status: DC
  Administered 2023-07-10 – 2023-07-11 (×3): 25 mg
  Filled 2023-07-10 (×3): qty 1

## 2023-07-10 MED ORDER — DEXMEDETOMIDINE HCL IN NACL 400 MCG/100ML IV SOLN
0.0000 ug/kg/h | INTRAVENOUS | Status: DC
  Administered 2023-07-10 – 2023-07-11 (×3): 0.4 ug/kg/h via INTRAVENOUS
  Filled 2023-07-10 (×3): qty 100

## 2023-07-10 MED ORDER — SODIUM CHLORIDE 0.9% FLUSH: 10.0000 mL | INTRAVENOUS | Status: DC | PRN

## 2023-07-10 MED ORDER — SODIUM CHLORIDE 0.9% FLUSH
10.0000 mL | Freq: Two times a day (BID) | INTRAVENOUS | Status: DC
Start: 2023-07-10 — End: 2023-07-15
  Administered 2023-07-10 – 2023-07-11 (×3): 10 mL
  Administered 2023-07-11: 19 mL
  Administered 2023-07-12: 10 mL
  Administered 2023-07-12: 30 mL
  Administered 2023-07-13: 10 mL

## 2023-07-10 MED ORDER — SODIUM ZIRCONIUM CYCLOSILICATE 5 G PO PACK
10.0000 g | PACK | Freq: Once | ORAL | Status: AC
  Administered 2023-07-10: 10 g
  Filled 2023-07-10: qty 2

## 2023-07-10 MED ORDER — JUVEN PO PACK
1.0000 | PACK | Freq: Two times a day (BID) | ORAL | Status: DC
  Administered 2023-07-10: 1

## 2023-07-10 NOTE — Plan of Care (Signed)
Patient remains intubated, unable to be extubate this am. Heart rate 140's and increased respiration's. Precedex added per Dr. Belia Heman. During trial patient was able to follow simple commands when asked. Patient tolerating tube feeds. Foley intact with adequate urine output. PICC line placed. Continue to assess.

## 2023-07-10 NOTE — Progress Notes (Signed)
History of Present Illness:  67 year old female presenting to [facility] ED from home via EMS on 07/08/2023 for evaluation of worsening shortness of breath.   History provided per chart review and spouse bedside report as patient is unable to participate in interview at this time. Patient was in her normal state of health until roughly 3 days ago on 07/05/2023 when she developed some sinus congestion and shortness of breath.  Over these 3 days she has had progressive dyspnea, wheezing and spouse reports dry nonproductive cough.  Of note patient reported on arrival a productive cough with yellowish sputum. She denied chest pain on arrival, as well as denying nausea/ vomiting/ diarrhea/ abdominal pain.  Spouse denies any fever or chills stating he checked her temperature and she was about 99 degrees.  She also reported using her albuterol rescue inhaler around-the-clock with minimal help. They do not have the capability to check her SpO2 at home and the patient does not utilize home oxygen during the day or at night.  Spouse reports her baseline as " lying on the couch".  When asked if she is able to take part in activity without shortness of breath he responded that he takes care of all the cleaning and cooking and that she does not have to do any activities. He reports that her PO diet has remained good and she has been eating and drinking well. He also reported at baseline she sleeps sitting up in a recliner chair. He states she is a former cigarette smoker, switching to vaping 3-4 years ago after she was diagnosed with COPD.  He is unclear of how much vaping the patient does during the day.  She has a history of EtOH abuse, however has reported cutting back and has been confirms she has 2-6 beers a day but has abstained since Friday, 07/05/2023 due to not feeling well.  He denies any withdrawal symptoms in the past when she has abstained from drinking EtOH.  He denies any recreational drug use. She was last  seen by pulmonology outpatient in August 2023 at that time she was evaluated for home oxygen and was not a candidate.   EMS administered 2 DuoNebs and Solu-Medrol for respiratory distress  ED Course: Upon arrival patient alert and awake diminished breath sounds and expiratory wheezing.  SpO2 low to mid 90s on room air, temperature 99.6 with tachycardia and hypertension. COVID/flu/RSV negative, chest x-ray negative for acute infiltrate.  Labs significant for hypokalemia, NAGMA, hypocalcemia & hypoalbuminemia. TRH consulted for admission to inpatient.    Significant Labs: ABG: pO2 64, Bicarb 28.6 K 2.1/Cl 113/CO2 13/BUN <5/Cr 0.34/Ca 5.5/Albumin 2.2/AST 12/Tprot 4.2  RBC 3.51/Hgb 11.6/Hct 33.2  Imaging Chest X-ray 12/3>> Hyperinflation with upper lobe predominant emphysema, compatible with history of COPD. No focal consolidation. EKG 12/3>> Sinus tach Medications Administered:   Pertinent  Medical History  Former tobacco use Current everyday vaping EtOH abuse COPD Hypertension Hypercholesterolemia Anxiety  Micro Data:  .mic Resp panel by RT-PCR (RSV, Flu A&B, Covid) Anterior Nasal Swab        Collection Time: 07/08/23 11:46 AM  Result Value Ref Range Status   SARS Coronavirus 2 by RT PCR NEGATIVE NEGATIVE Final   Influenza A by PCR NEGATIVE NEGATIVE Final   Influenza B by PCR NEGATIVE NEGATIVE Final   Resp Syncytial Virus by PCR NEGATIVE NEGATIVE Final  MRSA Next Gen by PCR, Nasal     Status: None   Collection Time: 07/09/23  2:39 AM  Result Value Ref  Range Status   MRSA by PCR Next Gen NOT DETECTED NOT DETECTED Final  Culture, Respiratory w Gram Stain     Status: None (Preliminary result)   Collection Time: 07/09/23  9:50 AM  Result Value Ref Range Status   Specimen Description   Final    TRACHEAL ASPIRATE Performed at [facility], [address], [place], [place] [address]    Special Requests   Final    NONE Performed at [facility], [address] [place], [place] [address]    Gram Stain   Final    RARE WBC PRESENT, PREDOMINANTLY PMN FEW GRAM POSITIVE COCCI IN PAIRS RARE GRAM NEGATIVE RODS Performed at [facility], [address] [place], [place] [address]    Culture PENDING  Incomplete   Report Status PENDING  Incomplete   Antimicrobials:   Start   Dose/Rate Frequency Ordered Stop  07/09/23 1200 azithromycin (ZITHROMAX) 500 mg in sodium chloride 0.9 % 250 mL IVPB  Status:  Discontinued        500 mg 250 mL/hr over 60 Minutes Every 24 hours 07/09/23 1054 07/09/23 1055  07/09/23 1200 cefTRIAXone (ROCEPHIN) 2 g in sodium chloride 0.9 % 100 mL IVPB        2 g 200 mL/hr over 30 Minutes Every 24 hours 07/09/23 1054    07/09/23 1145 azithromycin (ZITHROMAX) tablet 500 mg        500 mg Daily 07/09/23 1055 07/24/2023 0959  07/09/23 1000 doxycycline (VIBRA-TABS) tablet 100 mg  Status:  Discontinued        100 mg Every 12 hours 07/09/23 0653 07/09/23 1054  07/08/23 1415 doxycycline (VIBRA-TABS) tablet 100 mg  Status:  Discontinued        100 mg Every 12 hours 07/08/23 1409 07/09/23 0653    Significant Hospital Events: Including procedures, antibiotic start and stop dates in addition to other pertinent events   07/08/2023: Admit to inpatient with AECOPD by TRH, bronchodilators and doxycycline initiated.  Overnight patient with worsening respiratory distress and acute hypoxic respiratory failure secondary to AECOPD with high risk for intubation and mechanical ventilatory support placed on BiPAP-PCCM consulted. 07/09/23: We discussed the patient's COPD diagnosis at length bedside, as well as her HIGH RISK for INTUBATION and mechanical ventilatory support. We discussed that she is at a higher risk of long-term mechanical ventilatory support due to her severe COPD. We discussed  using ketamine IV bolus first to try to avoid mechanical ventilation.  07/10/23: Intubation and sedation continues. SBT/SAT performed. Pt was able to follow  commands, however, she experienced labored breathing with tachypnea and tachycardia and subsequently failed her SBT. Sedation restarted and pt placed back on full ventilatory support. Plan to attempt SBT/SAT tomorrow with family at bedside. Plan to place PICC line today due to multiple IV drips.   Interim History / Subjective:  See as outlined above in significant events section  Objective   Blood pressure 123/75, pulse (!) 110, temperature 98.5 F (36.9 C), resp. rate (!) 21, height 5' 5.98" (1.676 m), weight 66.7 kg, SpO2 97%.    Vent Mode: PRVC FiO2 (%):  [30 %] 30 % Set Rate:  [18 bmp] 18 bmp Vt Set:  [480 mL] 480 mL PEEP:  [5 cmH20] 5 cmH20 Plateau Pressure:  [15 cmH20-25 cmH20] 15 cmH20   Intake/Output Summary (Last 24 hours) at 07/10/2023 0756 Last data filed at 07/10/2023 0740 Gross per 24 hour  Intake 1946.51 ml  Output 1220 ml  Net  726.51 ml   Filed Weights   07/08/23 1138 07/09/23 0300 07/10/23 0359  Weight: 61 kg 64.8 kg 66.7 kg    Examination: General: Critically-ill adult female, intubated and sedated, no apparent distress HENT: Orally intubated. Atraumatic, normocephalic.  Lungs: Lung sounds coarse throughout. Mechanically ventilated. Thick secretions.  Cardiovascular: Regular rate and rhythm. S1, S2. No M/R/G. No edema noted.  Abdomen: Soft, non-tender, non-distended. Bowel sounds present x4 Extremities: No deformities, scattered bruising, +2 peripheral pulses.  Neuro: Sedated, unable to follow commands, PERRL +3 GU: Foley catheter in place.   Resolved Hospital Problem list   Hyperkalemia  Hypocalcemia  Assessment & Plan:  #Acute Hypoxic Respiratory Failure 2/2 to acute COPD exacerbation and suspected pneumonia  PMH: COPD, former tobacco use, current vape use  - Full vent support, implement lung protective strategies - Plateau pressures less than 30 cm H20 - Wean FiO2 & PEEP as tolerated to maintain O2 sats >88% - Follow intermittent CXR & ABG as  needed - SBT when respiratory parameters met and mental status permits - Implement VAP bundle - Continue Solumedrol 40mg /mL BID - Continue Budesonide nebs BID, Duo-neb Q4H & Albuterol neb Q4H PRN,  - Promote pulmonary hygiene as tolerated - Vaping cessation education once stabilized - Trend procalcitonin, monitor WBC/ fever curve - Head of bed elevated 30 degrees  #Sepsis 2/2 suspected pneumonia Meets SIRS Criteria: WBC 12.3, RR 21, HR 110  - Monitor fever curve - Trend WBC's & Procalcitonin - Follow cultures as above - Continue empiric ceftriaxone and azithromycin - pending blood cultures & sensitivities  - Respiratory PCR pending  #Hypertension - hold home losartan due to sedation leading to hypotension, continue restarting once extubated and tolerating PO medication  - continue Hydralazine IV as needed for SBP >160  #Hyperglycemia - Stress induced superimposed on steroids HgA1C 4.6  -CBG's q4h; Target range of 140 to 180 -SSI -Follow ICU Hypo/Hyperglycemia protocol   #Chronic Hyponatremia - Monitor and replace electrolytes as needed  #Hx Hyperlipidemia - continue home Atorvastatin 10mg  daily  #Sedation needs in setting of mechanical ventilation pain/discomfort #Anxiety #ETOH use, high risk for development of withdrawal (Husband reports that she has abstained since Fri 11/30, but does drink 2-6 beers daily) - continue Trazodone at bedtime as needed and scheduled PO Buspar - Titrate Precedex, Fentanyl, and Propofol gtts to maintain RASS of -1 to 0 - Start seroquel prior to extubation  - continue thiamine, multivitamin, folic acid daily - Assess CIWA score once patient extubated   Pt is critically ill with severe COPD Exacerbation and pneumonia requiring intubation and mechanical ventilation.  Prognosis is guarded, high risk for further decompensation, cardiac arrest and death.  Given severe COPD at baseline, anticipate difficult wean from ventilator.  Consider DNR/DNI  status.  May need to involve Palliative Care for ongoing GOC conversations.  Best Practice (right click and "Reselect all SmartList Selections" daily)   Diet/type: tubefeeds and NPO w/ meds via tube DVT prophylaxis: LMWH GI prophylaxis: H2B Lines: N/A Foley:  Yes, and it is still needed Code Status:  full code Last date of multidisciplinary goals of care discussion [07/09/2023]  12/5: Pt's spouse and her daughter-in-law updated at bedside.  Labs   CBC: Recent Labs  Lab 07/08/23 1146 07/09/23 0229 07/10/23 0437  WBC 7.8 11.5* 12.3*  HGB 11.6* 12.3 10.6*  HCT 33.2* 35.5* 31.9*  MCV 94.6 92.4 97.0  PLT 253 428* 319    Basic Metabolic Panel: Recent Labs  Lab 07/08/23 1146 07/08/23 2130 07/09/23  1308 07/09/23 0618 07/09/23 1055 07/09/23 2150 07/10/23 0437  NA 136 127* 128*  --   --   --  129*  K 2.1* 5.5* 6.1* 5.8* 5.3* 5.0 4.6  CL 113* 92* 93*  --   --   --  97*  CO2 13* 24 24  --   --   --  24  GLUCOSE 89 184* 194*  --   --   --  185*  BUN <5* 9 11  --   --   --  22  CREATININE 0.34* 0.63 0.53  --   --   --  0.72  CALCIUM 5.5* 9.2 9.1  --   --   --  8.9  MG  --  2.6* 2.6*  --   --   --  2.7*  PHOS  --  3.5  --   --   --   --  4.0   GFR: Estimated Creatinine Clearance: 65.6 mL/min (by C-G formula based on SCr of 0.72 mg/dL). Recent Labs  Lab 07/08/23 1146 07/09/23 0229 07/09/23 0618 07/10/23 0437  PROCALCITON  --   --  0.12 0.10  WBC 7.8 11.5*  --  12.3*    Liver Function Tests: Recent Labs  Lab 07/08/23 1146 07/09/23 0229 07/10/23 0437  AST 12* 34  --   ALT 10 20  --   ALKPHOS 52 96  --   BILITOT 0.6 0.9  --   PROT 4.2* 7.8  --   ALBUMIN 2.2* 3.8 3.2*   No results for input(s): "LIPASE", "AMYLASE" in the last 168 hours. No results for input(s): "AMMONIA" in the last 168 hours.  ABG    Component Value Date/Time   PHART 7.29 (L) 07/09/2023 0801   PCO2ART 52 (H) 07/09/2023 0801   PO2ART 80 (L) 07/09/2023 0801   HCO3 25.0 07/09/2023 0801    ACIDBASEDEF 2.3 (H) 07/09/2023 0801   O2SAT 97.6 07/09/2023 0801     Coagulation Profile: No results for input(s): "INR", "PROTIME" in the last 168 hours.  Cardiac Enzymes: No results for input(s): "CKTOTAL", "CKMB", "CKMBINDEX", "TROPONINI" in the last 168 hours.  HbA1C: Hgb A1c MFr Bld  Date/Time Value Ref Range Status  07/09/2023 02:29 AM 4.6 (L) 4.8 - 5.6 % Final    Comment:    (NOTE) Pre diabetes:          5.7%-6.4%  Diabetes:              >6.4%  Glycemic control for   <7.0% adults with diabetes     CBG: Recent Labs  Lab 07/09/23 1216 07/09/23 1524 07/09/23 1924 07/09/23 2324 07/10/23 0350  GLUCAP 156* 125* 131* 145* 176*    Review of Systems:    Unable to obtain review of systems due to patient condition  Past Medical History:  She,  has a past medical history of COPD exacerbation (HCC) (05/24/2021), History of blood transfusion, Hypercholesteremia, and Hypertension.   Surgical History:   Past Surgical History:  Procedure Laterality Date   EXTERNAL FIXATION REMOVAL Left 03/20/2018   Procedure: REMOVAL EXTERNAL FIXATION  LEFT LEG;  Surgeon: Myrene Galas, MD;  Location: [facility] OR;  Service: Orthopedics;  Laterality: Left;   EYE SURGERY Left    retina occulsion   ORIF ANKLE FRACTURE Left 02/08/2018   Procedure: external fixation left ankle;  Surgeon: Signa Kell, MD;  Location: [facility] ORS;  Service: Orthopedics;  Laterality: Left;     Social History:   reports that she quit  smoking about 2 years ago. Her smoking use included cigarettes and e-cigarettes. She started smoking about 22 years ago. She has a 10 pack-year smoking history. She has been exposed to tobacco smoke. She has never used smokeless tobacco. She reports that she does not currently use alcohol after a past usage of about 28.0 standard drinks of alcohol per week. She reports that she does not use drugs.   Family History:  Her family history is not on file.   Allergies Allergies  Allergen  Reactions   Penicillins Anaphylaxis, Swelling and Other (See Comments)    Tongue swells  Has patient had a PCN reaction causing immediate rash, facial/tongue/throat swelling, SOB or lightheadedness with hypotension: Y Has patient had a PCN reaction causing severe rash involving mucus membranes or skin necrosis: Unknown Has patient had a PCN reaction that required hospitalization: No Has patient had a PCN reaction occurring within the last 10 years: No If all of the above answers are "NO", then may proceed with Cephalosporin use.      Home Medications  Prior to Admission medications   Medication Sig Start Date End Date Taking? Authorizing Provider  albuterol (PROVENTIL) (2.5 MG/3ML) 0.083% nebulizer solution Take 3 mLs (2.5 mg total) by nebulization every 4 (four) hours as needed. 06/12/21  Yes Nyoka Cowden, MD  albuterol (VENTOLIN HFA) 108 (90 Base) MCG/ACT inhaler Inhale 2 puffs into the lungs 6 (six) times daily. Patient taking differently: Inhale 2 puffs into the lungs every 6 (six) hours as needed for shortness of breath or wheezing. 05/26/21  Yes Rai, Ripudeep K, MD  atorvastatin (LIPITOR) 10 MG tablet Take 10 mg by mouth in the morning. 01/28/18  Yes [provider]  busPIRone (BUSPAR) 15 MG tablet Take 15 mg by mouth in the morning. 04/10/21  Yes [provider]  escitalopram (LEXAPRO) 10 MG tablet Take 10 mg by mouth at bedtime. 05/23/23  Yes [provider]  fluticasone (FLONASE) 50 MCG/ACT nasal spray Place 2 sprays into both nostrils daily. Patient taking differently: Place 2 sprays into both nostrils daily as needed for rhinitis or allergies. 05/26/21  Yes Rai, Ripudeep K, MD  ipratropium-albuterol (DUONEB) 0.5-2.5 (3) MG/3ML SOLN Take 3 mLs by nebulization every 6 (six) hours as needed. 03/19/22  Yes Nyoka Cowden, MD  losartan (COZAAR) 50 MG tablet Take 50 mg by mouth in the morning. 02/01/18  Yes [provider]  Multiple Vitamin  (MULTIVITAMIN) tablet Take 1 tablet by mouth daily.   Yes [provider]  traZODone (DESYREL) 50 MG tablet Take 50 mg by mouth at bedtime as needed for sleep. 03/16/21  Yes [provider]  BREZTRI AEROSPHERE 160-9-4.8 MCG/ACT AERO Inhale 2 puffs into the lungs 2 (two) times daily. Patient not taking: Reported on 07/08/2023 06/24/23   [provider]     Critical care time: 40 minutes     Luberta Robertson, RN

## 2023-07-10 NOTE — Progress Notes (Signed)
Peripherally Inserted Central Catheter Placement  The IV Nurse has discussed with the patient and/or persons authorized to consent for the patient, the purpose of this procedure and the potential benefits and risks involved with this procedure.  The benefits include less needle sticks, lab draws from the catheter, and the patient may be discharged home with the catheter. Risks include, but not limited to, infection, bleeding, blood clot (thrombus formation), and puncture of an artery; nerve damage and irregular heartbeat and possibility to perform a PICC exchange if needed/ordered by physician.  Alternatives to this procedure were also discussed.  Bard Power PICC patient education guide, fact sheet on infection prevention and patient information card has been provided to patient /or left at bedside.    PICC Placement Documentation  PICC Triple Lumen 07/10/23 Right Basilic 36 cm 0 cm (Active)  Indication for Insertion or Continuance of Line Vasoactive infusions 07/10/23 1226  Exposed Catheter (cm) 0 cm 07/10/23 1226  Site Assessment Clean, Dry, Intact 07/10/23 1226  Lumen #1 Status Flushed;Blood return noted;Saline locked 07/10/23 1226  Lumen #2 Status Flushed;Blood return noted;Saline locked 07/10/23 1226  Lumen #3 Status Flushed;Blood return noted;Saline locked 07/10/23 1226  Dressing Type Transparent;Securing device 07/10/23 1226  Dressing Status Antimicrobial disc in place 07/10/23 1226  Line Adjustment (NICU/IV Team Only) No 07/10/23 1226  Dressing Change Due 07/17/23 07/10/23 1226       Romie Jumper 07/10/2023, 12:29 PM

## 2023-07-11 ENCOUNTER — Inpatient Hospital Stay: Payer: PPO

## 2023-07-11 DIAGNOSIS — J9602 Acute respiratory failure with hypercapnia: Secondary | ICD-10-CM | POA: Diagnosis not present

## 2023-07-11 DIAGNOSIS — J9601 Acute respiratory failure with hypoxia: Secondary | ICD-10-CM | POA: Diagnosis not present

## 2023-07-11 DIAGNOSIS — J441 Chronic obstructive pulmonary disease with (acute) exacerbation: Secondary | ICD-10-CM | POA: Diagnosis not present

## 2023-07-11 LAB — CBC
HCT: 29.3 % — ABNORMAL LOW (ref 36.0–46.0)
Hemoglobin: 9.8 g/dL — ABNORMAL LOW (ref 12.0–15.0)
MCH: 32.2 pg (ref 26.0–34.0)
MCHC: 33.4 g/dL (ref 30.0–36.0)
MCV: 96.4 fL (ref 80.0–100.0)
Platelets: 266 10*3/uL (ref 150–400)
RBC: 3.04 MIL/uL — ABNORMAL LOW (ref 3.87–5.11)
RDW: 13.3 % (ref 11.5–15.5)
WBC: 10.3 10*3/uL (ref 4.0–10.5)
nRBC: 0 % (ref 0.0–0.2)

## 2023-07-11 LAB — RENAL FUNCTION PANEL
Albumin: 2.8 g/dL — ABNORMAL LOW (ref 3.5–5.0)
Anion gap: 9 (ref 5–15)
BUN: 33 mg/dL — ABNORMAL HIGH (ref 8–23)
CO2: 24 mmol/L (ref 22–32)
Calcium: 8.4 mg/dL — ABNORMAL LOW (ref 8.9–10.3)
Chloride: 99 mmol/L (ref 98–111)
Creatinine, Ser: 0.69 mg/dL (ref 0.44–1.00)
GFR, Estimated: >60 mL/min (ref >60)
Glucose, Bld: 128 mg/dL — ABNORMAL HIGH (ref 70–99)
Phosphorus: 4.4 mg/dL (ref 2.5–4.6)
Potassium: 5 mmol/L (ref 3.5–5.1)
Sodium: 132 mmol/L — ABNORMAL LOW (ref 135–145)

## 2023-07-11 LAB — GLUCOSE, CAPILLARY
Glucose-Capillary: 112 mg/dL — ABNORMAL HIGH (ref 70–99)
Glucose-Capillary: 167 mg/dL — ABNORMAL HIGH (ref 70–99)
Glucose-Capillary: 119 mg/dL — ABNORMAL HIGH (ref 70–99)

## 2023-07-11 LAB — PROCALCITONIN: Procalcitonin: 0.11 ng/mL

## 2023-07-11 LAB — MAGNESIUM: Magnesium: 2.5 mg/dL — ABNORMAL HIGH (ref 1.7–2.4)

## 2023-07-11 MED ORDER — SODIUM CHLORIDE 0.9 % IV SOLN
INTRAVENOUS | Status: DC
  Administered 2023-07-11: 20 mL/h via INTRAVENOUS

## 2023-07-11 MED ORDER — GLYCOPYRROLATE 0.2 MG/ML IJ SOLN
0.2000 mg | INTRAMUSCULAR | Status: DC | PRN
Start: 2023-07-11 — End: 2023-07-14

## 2023-07-11 MED ORDER — ACETAMINOPHEN 325 MG PO TABS: 650.0000 mg | ORAL_TABLET | Freq: Four times a day (QID) | ORAL | Status: DC | PRN

## 2023-07-11 MED ORDER — MIDAZOLAM HCL 2 MG/2ML IJ SOLN: 2.0000 mg | INTRAMUSCULAR | Status: DC | PRN

## 2023-07-11 MED ORDER — GLYCOPYRROLATE 1 MG PO TABS
1.0000 mg | ORAL_TABLET | ORAL | Status: DC | PRN
Start: 2023-07-11 — End: 2023-07-14

## 2023-07-11 MED ORDER — MORPHINE SULFATE (PF) 2 MG/ML IV SOLN
2.0000 mg | INTRAVENOUS | Status: DC | PRN
  Administered 2023-07-11 (×3): 2 mg via INTRAVENOUS
  Filled 2023-07-11 (×3): qty 1

## 2023-07-11 MED ORDER — RACEPINEPHRINE HCL 2.25 % IN NEBU
0.5000 mL | INHALATION_SOLUTION | RESPIRATORY_TRACT | Status: DC | PRN
  Administered 2023-07-11: 0.5 mL via RESPIRATORY_TRACT

## 2023-07-11 MED ORDER — MORPHINE 100MG IN NS 100ML (1MG/ML) PREMIX INFUSION
0.0000 mg/h | INTRAVENOUS | Status: DC
  Administered 2023-07-11: 2 mg/h via INTRAVENOUS
  Administered 2023-07-12 – 2023-07-13 (×2): 5 mg/h via INTRAVENOUS
  Administered 2023-07-13: 10 mg/h via INTRAVENOUS
  Filled 2023-07-11 (×4): qty 100

## 2023-07-11 MED ORDER — POLYVINYL ALCOHOL 1.4 % OP SOLN: 1.0000 [drp] | Freq: Four times a day (QID) | OPHTHALMIC | Status: DC | PRN

## 2023-07-11 MED ORDER — RACEPINEPHRINE HCL 2.25 % IN NEBU
INHALATION_SOLUTION | RESPIRATORY_TRACT | Status: AC
  Filled 2023-07-11: qty 0.5

## 2023-07-11 MED ORDER — MORPHINE BOLUS VIA INFUSION
5.0000 mg | INTRAVENOUS | Status: DC | PRN
  Administered 2023-07-12 – 2023-07-13 (×4): 5 mg via INTRAVENOUS

## 2023-07-11 MED ORDER — PROSOURCE TF20 ENFIT COMPATIBL EN LIQD
60.0000 mL | Freq: Every day | ENTERAL | Status: DC
  Filled 2023-07-11: qty 60

## 2023-07-11 MED ORDER — ACETAMINOPHEN 650 MG RE SUPP: 650.0000 mg | Freq: Four times a day (QID) | RECTAL | Status: DC | PRN

## 2023-07-11 MED ORDER — GLYCOPYRROLATE 0.2 MG/ML IJ SOLN
0.2000 mg | INTRAMUSCULAR | Status: DC | PRN
  Administered 2023-07-12 – 2023-07-13 (×4): 0.2 mg via INTRAVENOUS
  Filled 2023-07-11 (×4): qty 1

## 2023-07-11 NOTE — Progress Notes (Signed)
0800 Patient found with mitt-ed hand pulling on OG. Measurement at corner of mouth was 35. Tube feeding stopped. OG reinserted to 65 and abd. Xray ordered. When patient suctioned -no tube feeding colored sputum noted. Awaiting xray report before tube feeding restarted.

## 2023-07-11 NOTE — Plan of Care (Signed)
  Problem: Clinical Measurements: Goal: Diagnostic test results will improve Outcome: Progressing Goal: Respiratory complications will improve Outcome: Progressing   Problem: Nutrition: Goal: Adequate nutrition will be maintained Outcome: Progressing   Problem: Respiratory: Goal: Ability to maintain a clear airway and adequate ventilation will improve Outcome: Progressing   Problem: Skin Integrity: Goal: Risk for impaired skin integrity will decrease Outcome: Progressing

## 2023-07-11 NOTE — Progress Notes (Signed)
   07/11/23 1300  Spiritual Encounters  Type of Visit Initial  Care provided to: Pt and family  Referral source Nurse (RN/NT/LPN)  Reason for visit End-of-life  OnCall Visit Yes  Spiritual Framework  Patient Stress Factors Major life changes  Family Stress Factors Major life changes  Interventions  Spiritual Care Interventions Made Established relationship of care and support;Compassionate presence;Prayer  Intervention Outcomes  Outcomes Connection to spiritual care;Awareness of support  Spiritual Care Plan  Spiritual Care Issues Still Outstanding Chaplain will continue to follow   Received phone call from [facility] Station about Patient Emily Bishop. She is at EOL care and spoke with the family also the patient to see if there was anything I could do for them. Was asked to prayer for the patient and also let them know that we are here for them doing this time.

## 2023-07-11 NOTE — Consult Note (Signed)
NAME:  Emily Bishop, MRN:  [id number], DOB:  [birth year], LOS: 3 ADMISSION DATE:  07/08/2023, CONSULTATION DATE:  07/09/23 REFERRING MD:  Randye Lobo, NP, CHIEF COMPLAINT: Shortness of breath  History of Present Illness:  67 year old female presenting to [facility] ED from home via EMS on 07/08/2023 for evaluation of worsening shortness of breath.  History provided per chart review and spouse bedside report as patient is unable to participate in interview at this time. Patient was in her normal state of health until roughly 3 days ago on 07/05/2023 when she developed some sinus congestion and shortness of breath.  Over these 3 days she has had progressive dyspnea, wheezing and spouse reports dry nonproductive cough.  Of note patient reported on arrival a productive cough with yellowish sputum. She denied chest pain on arrival, as well as denying nausea/ vomiting/ diarrhea/ abdominal pain.  Spouse denies any fever or chills stating he checked her temperature and she was about 99 degrees.  She also reported using her albuterol rescue inhaler around-the-clock with minimal help. They do not have the capability to check her SpO2 at home and the patient does not utilize home oxygen during the day or at night.  Spouse reports her baseline as " lying on the couch".  When asked if she is able to take part in activity without shortness of breath he responded that he takes care of all the cleaning and cooking and that she does not have to do any activities. He reports that her PO diet has remained good and she has been eating and drinking well. He also reported at baseline she sleeps sitting up in a recliner chair. He states she is a former cigarette smoker, switching to vaping 3-4 years ago after she was diagnosed with COPD.  He is unclear of how much vaping the patient does during the day.  She has a history of EtOH abuse, however has reported cutting back and has been confirms she has 2-6 beers a day but has abstained  since Friday, 07/05/2023 due to not feeling well.  He denies any withdrawal symptoms in the past when she has abstained from drinking EtOH.  He denies any recreational drug use. She was last seen by pulmonology outpatient in August 2023 at that time she was evaluated for home oxygen and was not a candidate.  EMS administered 2 DuoNebs and Solu-Medrol for respiratory distress. ED course: Upon arrival patient alert and awake diminished breath sounds and expiratory wheezing.  SpO2 low to mid 90s on room air, temperature 99.6 with tachycardia and hypertension. COVID/flu/RSV negative, chest x-ray negative for acute infiltrate.  Labs significant for hypokalemia, NAGMA, hypocalcemia & hypoalbuminemia. TRH consulted for admission to inpatient.  ABG: 7.30/ 51/ 146/ 25.1 CXR 07/09/23: Emphysematous and chronic bronchitic changes in the lungs no evidence of active pulmonary disease  Hospital course: Overnight patient with worsening shortness of breath, tripoding, anxious in respiratory distress.  She was given several DuoNeb treatments and Ativan, placed on BiPAP for support and transferred to the ICU. PCCM consulted for assistance in management and monitoring due to acute hypoxic respiratory failure secondary to AECOPD with a high risk for intubation and mechanical ventilatory support.  Pertinent  Medical History  Former tobacco use Current everyday vaping EtOH abuse COPD Hypertension Hypercholesterolemia Anxiety Significant Hospital Events: Including procedures, antibiotic start and stop dates in addition to other pertinent events   07/08/2023: Admit to inpatient with AECOPD by TRH, bronchodilators and doxycycline initiated.  Overnight patient with worsening respiratory  distress and acute hypoxic respiratory failure secondary to AECOPD with high risk for intubation and mechanical ventilatory support placed on BiPAP-PCCM consulted. 12-4 severe end stage COPD 12/5 failed SAT/SBT family at  bedside  Interim History / Subjective:   Remains critically ill Severe end stage COPD Severe resp failure   Objective   Blood pressure (!) 92/58, pulse 78, temperature 98.8 F (37.1 C), temperature source Axillary, resp. rate 18, height 5' 5.98" (1.676 m), weight 67.5 kg, SpO2 97%.    Vent Mode: PRVC FiO2 (%):  [28 %] 28 % Set Rate:  [18 bmp] 18 bmp Vt Set:  [480 mL] 480 mL PEEP:  [5 cmH20] 5 cmH20 Plateau Pressure:  [24 cmH20] 24 cmH20   Intake/Output Summary (Last 24 hours) at 07/11/2023 0715 Last data filed at 07/11/2023 1610 Gross per 24 hour  Intake 2580.03 ml  Output 1050 ml  Net 1530.03 ml   Filed Weights   07/09/23 0300 07/10/23 0359 07/11/23 0500  Weight: 64.8 kg 66.7 kg 67.5 kg     REVIEW OF SYSTEMS  PATIENT IS UNABLE TO PROVIDE COMPLETE REVIEW OF SYSTEMS DUE TO SEVERE CRITICAL ILLNESS   PHYSICAL EXAMINATION:  GENERAL:critically ill appearing, +resp distress EYES: Pupils equal, round, reactive to light.  No scleral icterus.  MOUTH: Moist mucosal membrane. INTUBATED NECK: Supple.  PULMONARY: Lungs clear to auscultation, +rhonchi, +wheezing CARDIOVASCULAR: S1 and S2.  Regular rate and rhythm GASTROINTESTINAL: Soft, nontender, -distended. Positive bowel sounds.  MUSCULOSKELETAL: No swelling, clubbing, or edema.  NEUROLOGIC: obtunded,sedated SKIN:normal, warm to touch, Capillary refill delayed  Pulses present bilaterally    Assessment & Plan:  Acute Hypoxic Respiratory Failure secondary to AECOPD PMHx: End-stage COPD, tobacco use, pulmonary nodules      Severe ACUTE Hypoxic and Hypercapnic Respiratory Failure -continue Mechanical Ventilator support -Wean Fio2 and PEEP as tolerated -VAP/VENT bundle implementation - Wean PEEP & FiO2 as tolerated, maintain SpO2 > 88% - Head of bed elevated 30 degrees, VAP protocol in place - Plateau pressures less than 30 cm H20  - Intermittent chest x-ray & ABG PRN - Ensure adequate pulmonary hygiene  -will  perform SAT/SBT when respiratory parameters are met when family at bedside   SEVERE COPD EXACERBATION -continue IV steroids as prescribed -continue NEB THERAPY as prescribed -morphine as needed -wean fio2 as needed and tolerated  ELECTROLYTES -follow labs as needed -replace as needed -pharmacy consultation and following Hypokalemic now hyperkalemic post supplementation Hyponatremia Hypocalcemia-   NEUROLOGY ACUTE METABOLIC ENCEPHALOPATHY DUE TO COPD exacerbation -need for sedation -Goal RASS -2 to -3   ENDO - ICU hypoglycemic\Hyperglycemia protocol -check FSBS per protocol   GI GI PROPHYLAXIS as indicated  NUTRITIONAL STATUS DIET-->TF's as tolerated Constipation protocol as indicated     Best Practice (right click and "Reselect all SmartList Selections" daily)  Diet/type: NPO, while on BiPAP support DVT prophylaxis: Place and maintain sequential compression device Start: 07/09/23 1623 enoxaparin (LOVENOX) injection 40 mg Start: 07/08/23 2200  Pressure ulcer(s): not present on admission  GI prophylaxis: N/A Lines: N/A Foley:  N/A Code Status:  full code Last date of multidisciplinary goals of care discussion [07/09/23] 07/09/23: We discussed the patient's COPD diagnosis at length bedside as well as her HIGH RISK for INTUBATION and mechanical ventilatory support.  We discussed that she is at a higher risk of long-term mechanical ventilatory support due to her severe COPD. We discussed using ketamine IV bolus first to try to avoid mechanical ventilation.      Labs   CBC: Recent Labs  Lab 07/08/23 1146 07/09/23 0229 07/10/23 0437 07/11/23 0413  WBC 7.8 11.5* 12.3* 10.3  HGB 11.6* 12.3 10.6* 9.8*  HCT 33.2* 35.5* 31.9* 29.3*  MCV 94.6 92.4 97.0 96.4  PLT 253 428* 319 266    Basic Metabolic Panel: Recent Labs  Lab 07/08/23 1146 07/08/23 2130 07/09/23 0229 07/09/23 0618 07/09/23 1055 07/09/23 2150 07/10/23 0437 07/11/23 0413  NA 136 127* 128*   --   --   --  129* 132*  K 2.1* 5.5* 6.1* 5.8* 5.3* 5.0 4.6 5.0  CL 113* 92* 93*  --   --   --  97* 99  CO2 13* 24 24  --   --   --  24 24  GLUCOSE 89 184* 194*  --   --   --  185* 128*  BUN <5* 9 11  --   --   --  22 33*  CREATININE 0.34* 0.63 0.53  --   --   --  0.72 0.69  CALCIUM 5.5* 9.2 9.1  --   --   --  8.9 8.4*  MG  --  2.6* 2.6*  --   --   --  2.7* 2.5*  PHOS  --  3.5  --   --   --   --  4.0 4.4   GFR: Estimated Creatinine Clearance: 65.6 mL/min (by C-G formula based on SCr of 0.69 mg/dL). Recent Labs  Lab 07/08/23 1146 07/09/23 0229 07/09/23 0618 07/10/23 0437 07/11/23 0413  PROCALCITON  --   --  0.12 0.10 0.11  WBC 7.8 11.5*  --  12.3* 10.3    Liver Function Tests: Recent Labs  Lab 07/08/23 1146 07/09/23 0229 07/10/23 0437 07/11/23 0413  AST 12* 34  --   --   ALT 10 20  --   --   ALKPHOS 52 96  --   --   BILITOT 0.6 0.9  --   --   PROT 4.2* 7.8  --   --   ALBUMIN 2.2* 3.8 3.2* 2.8*   No results for input(s): "LIPASE", "AMYLASE" in the last 168 hours. No results for input(s): "AMMONIA" in the last 168 hours.  ABG    Component Value Date/Time   PHART 7.29 (L) 07/09/2023 0801   PCO2ART 52 (H) 07/09/2023 0801   PO2ART 80 (L) 07/09/2023 0801   HCO3 25.0 07/09/2023 0801   ACIDBASEDEF 2.3 (H) 07/09/2023 0801   O2SAT 97.6 07/09/2023 0801     Coagulation Profile: No results for input(s): "INR", "PROTIME" in the last 168 hours.  Cardiac Enzymes: No results for input(s): "CKTOTAL", "CKMB", "CKMBINDEX", "TROPONINI" in the last 168 hours.  HbA1C: Hgb A1c MFr Bld  Date/Time Value Ref Range Status  07/09/2023 02:29 AM 4.6 (L) 4.8 - 5.6 % Final    Comment:    (NOTE) Pre diabetes:          5.7%-6.4%  Diabetes:              >6.4%  Glycemic control for   <7.0% adults with diabetes     CBG: Recent Labs  Lab 07/10/23 1313 07/10/23 1513 07/10/23 1947 07/10/23 2334 07/11/23 0406  GLUCAP 135* 150* 161* 177* 112*    Review of Systems:   UTA-  patient in respiratory distress, unable to participate in interview at this time  Past Medical History:  She,  has a past medical history of COPD exacerbation (HCC) (05/24/2021), History of blood transfusion, Hypercholesteremia, and Hypertension.   Surgical History:  Past Surgical History:  Procedure Laterality Date   EXTERNAL FIXATION REMOVAL Left 03/20/2018   Procedure: REMOVAL EXTERNAL FIXATION  LEFT LEG;  Surgeon: Myrene Galas, MD;  Location: [facility] OR;  Service: Orthopedics;  Laterality: Left;   EYE SURGERY Left    retina occulsion   ORIF ANKLE FRACTURE Left 02/08/2018   Procedure: external fixation left ankle;  Surgeon: Signa Kell, MD;  Location: [facility] ORS;  Service: Orthopedics;  Laterality: Left;     Social History:   reports that she quit smoking about 2 years ago. Her smoking use included cigarettes and e-cigarettes. She started smoking about 22 years ago. She has a 10 pack-year smoking history. She has been exposed to tobacco smoke. She has never used smokeless tobacco. She reports that she does not currently use alcohol after a past usage of about 28.0 standard drinks of alcohol per week. She reports that she does not use drugs.   Family History:  Her family history is not on file.   Allergies Allergies  Allergen Reactions   Penicillins Anaphylaxis, Swelling and Other (See Comments)    Tongue swells  Has patient had a PCN reaction causing immediate rash, facial/tongue/throat swelling, SOB or lightheadedness with hypotension: Y Has patient had a PCN reaction causing severe rash involving mucus membranes or skin necrosis: Unknown Has patient had a PCN reaction that required hospitalization: No Has patient had a PCN reaction occurring within the last 10 years: No If all of the above answers are "NO", then may proceed with Cephalosporin use.      Home Medications  Prior to Admission medications   Medication Sig Start Date End Date Taking? Authorizing Provider  albuterol  (PROVENTIL) (2.5 MG/3ML) 0.083% nebulizer solution Take 3 mLs (2.5 mg total) by nebulization every 4 (four) hours as needed. 06/12/21  Yes Nyoka Cowden, MD  albuterol (VENTOLIN HFA) 108 (90 Base) MCG/ACT inhaler Inhale 2 puffs into the lungs 6 (six) times daily. Patient taking differently: Inhale 2 puffs into the lungs every 6 (six) hours as needed for shortness of breath or wheezing. 05/26/21  Yes Rai, Ripudeep K, MD  atorvastatin (LIPITOR) 10 MG tablet Take 10 mg by mouth in the morning. 01/28/18  Yes [provider]  busPIRone (BUSPAR) 15 MG tablet Take 15 mg by mouth in the morning. 04/10/21  Yes [provider]  escitalopram (LEXAPRO) 10 MG tablet Take 10 mg by mouth at bedtime. 05/23/23  Yes [provider]  fluticasone (FLONASE) 50 MCG/ACT nasal spray Place 2 sprays into both nostrils daily. Patient taking differently: Place 2 sprays into both nostrils daily as needed for rhinitis or allergies. 05/26/21  Yes Rai, Ripudeep K, MD  ipratropium-albuterol (DUONEB) 0.5-2.5 (3) MG/3ML SOLN Take 3 mLs by nebulization every 6 (six) hours as needed. 03/19/22  Yes Nyoka Cowden, MD  losartan (COZAAR) 50 MG tablet Take 50 mg by mouth in the morning. 02/01/18  Yes [provider]  Multiple Vitamin (MULTIVITAMIN) tablet Take 1 tablet by mouth daily.   Yes [provider]  traZODone (DESYREL) 50 MG tablet Take 50 mg by mouth at bedtime as needed for sleep. 03/16/21  Yes [provider]  BREZTRI AEROSPHERE 160-9-4.8 MCG/ACT AERO Inhale 2 puffs into the lungs 2 (two) times daily. Patient not taking: Reported on 07/08/2023 06/24/23   [provider]      DVT/GI PRX  assessed I Assessed the need for Labs I Assessed the need for Foley I Assessed the need for Central Venous Line  Family Discussion when available I Assessed the need for Mobilization I made an Assessment of medications to be adjusted accordingly Safety Risk assessment  completed  CASE DISCUSSED IN MULTIDISCIPLINARY ROUNDS WITH ICU TEAM     Critical Care Time devoted to patient care services described in this note is 55 minutes.  Critical care was necessary to treat /prevent imminent and life-threatening deterioration. Overall, patient is critically ill, prognosis is guarded.  Patient with Multiorgan failure and at high risk for cardiac arrest and death.    Lucie Leather, M.D.  [facility]  Medical Director [facility] Medical Director [facility] Cardio-Pulmonary Department

## 2023-07-11 NOTE — Progress Notes (Addendum)
1100 After discussion with the family, decisiion for DNR/DNI with one way extubation made by family.  1130 Extubated to 4 L nasal cannula. 1151 Medicated with Morphine for shortness of breath. 1200 Medicated for nausea. Patient tolerating extubation. Family in with patient. Patient is breathing easier . 1210 Nausea resolved. 1400 After discussion with the family, Dr Belia Heman transitioned the patient to comfort care. Morphine drip started. 1600 Morphine drip  adjusted as needed for shortness of breath.

## 2023-07-11 NOTE — IPAL (Signed)
  Interdisciplinary Goals of Care Family Meeting   Date carried out: 07/11/2023  Location of the meeting: Bedside  Member's involved: Physician, Bedside Registered Nurse, and Family Member or next of kin      GOALS OF CARE DISCUSSION  The Clinical status was relayed to family in detail- Husband at Bedside  Updated and notified of patients medical condition- Patient with increased WOB and using accessory muscles to breathe Explained to family course of therapy and the modalities   Patient with Progressive multiorgan failure with a very high probablity of a very minimal chance of meaningful recovery despite all aggressive and optimal medical therapy.   Family understands the situation.  Husband has consented and agreed to DNR/DNI and would like to proceed with Comfort care measures.  Family are satisfied with Plan of action and management. All questions answered  Additional CC time 25 mins   Emily Bishop Santiago Glad, M.D.  [facility]  Medical Director [facility] Medical Director [facility] Cardio-Pulmonary Department

## 2023-07-11 NOTE — Progress Notes (Signed)
Pt. Extubated to 4L.

## 2023-07-11 NOTE — IPAL (Signed)
  Interdisciplinary Goals of Care Family Meeting   Date carried out: 07/11/2023  Location of the meeting: Conference room  Member's involved: Physician, Bedside Registered Nurse, and Family Member or next of kin    GOALS OF CARE DISCUSSION  The Clinical status was relayed to family in detail- Husband, Son and Daughter In Social worker  Updated and notified of patients medical condition- Patient with increased WOB and using accessory muscles to breathe Explained to family course of therapy and the modalities   Patient with Progressive multiorgan failure with a very high probablity of a very minimal chance of meaningful recovery despite all aggressive and optimal medical therapy.    Family understands the situation.  They have consented and agreed to DNR/DNI and would like to proceed with one way extubation and will plan for comfort care measures if resp status declines after intubation  Family are satisfied with Plan of action and management. All questions answered  Additional CC time 35 mins   Srihith Aquilino Santiago Glad, M.D.  [facility]  Medical Director [facility] Medical Director [facility] Cardio-Pulmonary Department

## 2023-07-12 DIAGNOSIS — G9341 Metabolic encephalopathy: Secondary | ICD-10-CM | POA: Insufficient documentation

## 2023-07-12 DIAGNOSIS — Z515 Encounter for palliative care: Secondary | ICD-10-CM

## 2023-07-12 DIAGNOSIS — R6510 Systemic inflammatory response syndrome (SIRS) of non-infectious origin without acute organ dysfunction: Secondary | ICD-10-CM | POA: Insufficient documentation

## 2023-07-12 DIAGNOSIS — J9621 Acute and chronic respiratory failure with hypoxia: Secondary | ICD-10-CM | POA: Diagnosis not present

## 2023-07-12 DIAGNOSIS — E878 Other disorders of electrolyte and fluid balance, not elsewhere classified: Secondary | ICD-10-CM | POA: Insufficient documentation

## 2023-07-12 DIAGNOSIS — E872 Acidosis, unspecified: Secondary | ICD-10-CM | POA: Insufficient documentation

## 2023-07-12 DIAGNOSIS — J441 Chronic obstructive pulmonary disease with (acute) exacerbation: Secondary | ICD-10-CM | POA: Diagnosis not present

## 2023-07-12 DIAGNOSIS — J9622 Acute and chronic respiratory failure with hypercapnia: Secondary | ICD-10-CM

## 2023-07-12 MED ORDER — ONDANSETRON HCL 4 MG/2ML IJ SOLN: 4.0000 mg | Freq: Four times a day (QID) | INTRAMUSCULAR | Status: DC | PRN

## 2023-07-12 MED ORDER — GLYCOPYRROLATE 0.2 MG/ML IJ SOLN: 0.2000 mg | INTRAMUSCULAR | Status: DC | PRN

## 2023-07-12 MED ORDER — SODIUM CHLORIDE 0.9% FLUSH: 10.0000 mL | Freq: Two times a day (BID) | INTRAVENOUS | Status: DC

## 2023-07-12 MED ORDER — LORAZEPAM 2 MG/ML IJ SOLN
1.0000 mg | INTRAMUSCULAR | Status: DC | PRN
  Filled 2023-07-12: qty 1

## 2023-07-12 MED ORDER — LORAZEPAM 2 MG/ML IJ SOLN: 1.0000 mg | INTRAMUSCULAR | Status: DC | PRN

## 2023-07-12 MED ORDER — POLYVINYL ALCOHOL 1.4 % OP SOLN: 1.0000 [drp] | OPHTHALMIC | Status: DC | PRN

## 2023-07-12 MED ORDER — ORAL CARE MOUTH RINSE: 15.0000 mL | OROMUCOSAL | Status: DC

## 2023-07-12 MED ORDER — MORPHINE 100MG IN NS 100ML (1MG/ML) PREMIX INFUSION: 2.0000 mg/h | INTRAVENOUS | Status: DC

## 2023-07-12 NOTE — Assessment & Plan Note (Signed)
On admission. °

## 2023-07-12 NOTE — Progress Notes (Signed)
Family changed their mind about transferring to the hospice home and are anticipating in-hospital passing away.  Continue morphine drip.  Will transfer to 1C.  Family requesting palliative care consultation.  Dr. Alford Highland

## 2023-07-12 NOTE — Assessment & Plan Note (Signed)
Numerous electrolyte abnormalities with hyponatremia, hypokalemia than hyperkalemia, hypocalcemia.

## 2023-07-12 NOTE — Plan of Care (Signed)
  Problem: Clinical Measurements: Goal: Will remain free from infection Outcome: Progressing   Problem: Pain Management: Goal: General experience of comfort will improve Outcome: Progressing   Problem: Skin Integrity: Goal: Risk for impaired skin integrity will decrease Outcome: Progressing   Problem: Coping: Goal: Ability to adjust to condition or change in health will improve Outcome: Progressing

## 2023-07-12 NOTE — Assessment & Plan Note (Addendum)
Initially patient was started on antibiotics and Solu-Medrol and given nebulizer treatments.  Haemophilus influenzae growing out of sputum culture.

## 2023-07-12 NOTE — Plan of Care (Signed)
  Problem: Clinical Measurements: Goal: Will remain free from infection Outcome: Progressing Goal: Respiratory complications will improve Outcome: Progressing   Problem: Coping: Goal: Level of anxiety will decrease Outcome: Progressing   Problem: Pain Management: Goal: General experience of comfort will improve Outcome: Progressing

## 2023-07-12 NOTE — TOC Initial Note (Signed)
Transition of Care [facility]) - Initial/Assessment Note    Patient Details  Name: Emily Bishop MRN: [id number] Date of Birth: [birth year]  [facility] CM/SW Contact:    Colette Ribas, LCSWA Phone Number: 07/12/2023, 12:30 PM  Clinical Narrative:                  CSW received msg from MD surrouding hospice CSW spoke with spouse who gave permission to speak with daughter in law. She requested patient referral be sent to [facility] in [place]. I spoke with Adell Clinical Director [phone] who advised to fax HPI & DC summary. I Faxed info. I spoke with [facility] about transport that advised they'd need to speak with a supervisor regarding 1.5 transport. I relayed to MD & Adell. I asked if the home had their own transport she advised she will let a CSW contact me back,  12:33PM: Per secure chat RN advised family wants to cancel hospice service and transfer patient to the telemetry floor.        Patient Goals and CMS Choice            Expected Discharge Plan and Services         Expected Discharge Date: 07/12/23                                    Prior Living Arrangements/Services                       Activities of Daily Living   ADL Screening (condition at time of admission) Independently performs ADLs?: Yes (appropriate for developmental age) Is the patient deaf or have difficulty hearing?: No Does the patient have difficulty seeing, even when wearing glasses/contacts?: No Does the patient have difficulty concentrating, remembering, or making decisions?: No  Permission Sought/Granted                  Emotional Assessment              Admission diagnosis:  Hypocalcemia [E83.51] COPD (chronic obstructive pulmonary disease) (HCC) [J44.9] Hypokalemia [E87.6] COPD exacerbation (HCC) [J44.1] Patient Active Problem List   Diagnosis Date Noted   End of life care 07/12/2023   Acute on chronic respiratory  failure with hypoxia and hypercapnia (HCC) 07/12/2023   Electrolyte abnormality 07/12/2023   Acute metabolic encephalopathy 07/12/2023   SIRS (systemic inflammatory response syndrome) (HCC) 07/12/2023   Metabolic acidosis 07/12/2023   COPD exacerbation (HCC) 07/08/2023   Hyponatremia 06/13/2021   Acute hypoxic respiratory failure (HCC) 05/25/2021   COPD  GOLD ? / group D  05/24/2021   Weight loss 05/24/2021   Thrush 05/24/2021   Hypertensive emergency 05/24/2021   Ankle fracture 02/07/2018   PCP:  Ailene Ravel, MD Pharmacy:   [facility] [phone] [place], [place] - [address] AT [facility] [address] [place] [address] Phone: [phone] Fax: [phone]     Social Determinants of Health (SDOH) Social History: SDOH Screenings   Food Insecurity: No Food Insecurity (07/08/2023)  Housing: Low Risk  (07/08/2023)  Transportation Needs: No Transportation Needs (07/08/2023)  Utilities: Not At Risk (07/08/2023)  Tobacco Use: Medium Risk (07/08/2023)   SDOH Interventions:     Readmission Risk Interventions     No data to display

## 2023-07-12 NOTE — Assessment & Plan Note (Signed)
Patient was made comfort care on 12/6 and started on morphine drip.  Patient is unresponsive on morphine drip.  Patient changed over to Dilaudid drip on 12/8 in the evening.  Continue as needed glycopyrrolate needed to be given for upper airway congestion.

## 2023-07-12 NOTE — Assessment & Plan Note (Signed)
Present on admission with COPD exacerbation.

## 2023-07-12 NOTE — Hospital Course (Signed)
67 year old female presenting to [facility] ED from home via EMS on 07/08/2023 for evaluation of worsening shortness of breath.   History provided per chart review and spouse bedside report as patient is unable to participate in interview at this time. Patient was in her normal state of health until roughly 3 days ago on 07/05/2023 when she developed some sinus congestion and shortness of breath.  Over these 3 days she has had progressive dyspnea, wheezing and spouse reports dry nonproductive cough.  Of note patient reported on arrival a productive cough with yellowish sputum. She denied chest pain on arrival, as well as denying nausea/ vomiting/ diarrhea/ abdominal pain.  Spouse denies any fever or chills stating he checked her temperature and she was about 99 degrees.  She also reported using her albuterol rescue inhaler around-the-clock with minimal help. They do not have the capability to check her SpO2 at home and the patient does not utilize home oxygen during the day or at night.  Spouse reports her baseline as " lying on the couch".  When asked if she is able to take part in activity without shortness of breath he responded that he takes care of all the cleaning and cooking and that she does not have to do any activities. He reports that her PO diet has remained good and she has been eating and drinking well. He also reported at baseline she sleeps sitting up in a recliner chair. He states she is a former cigarette smoker, switching to vaping 3-4 years ago after she was diagnosed with COPD.  He is unclear of how much vaping the patient does during the day.  She has a history of EtOH abuse, however has reported cutting back and has been confirms she has 2-6 beers a day but has abstained since Friday, 07/05/2023 due to not feeling well.  He denies any withdrawal symptoms in the past when she has abstained from drinking EtOH.  He denies any recreational drug use. She was last seen by pulmonology outpatient in  August 2023 at that time she was evaluated for home oxygen and was not a candidate.  ED course: Upon arrival patient alert and awake diminished breath sounds and expiratory wheezing.  SpO2 low to mid 90s on room air, temperature 99.6 with tachycardia and hypertension. COVID/flu/RSV negative, chest x-ray negative for acute infiltrate.  Labs significant for hypokalemia, NAGMA, hypocalcemia & hypoalbuminemia. TRH consulted for admission to inpatient.  Hospital course: Overnight patient with worsening shortness of breath, tripoding, anxious in respiratory distress.  She was given several DuoNeb treatments and Ativan, placed on BiPAP for support and transferred to the ICU. PCCM consulted for assistance in management and monitoring due to acute hypoxic respiratory failure secondary to AECOPD with a high risk for intubation and mechanical ventilatory support.  Patient was made comfort care measures on 12/6 and started on morphine drip.  Patient seen on 12/7 and family was interested in hospice home.

## 2023-07-12 NOTE — Progress Notes (Addendum)
0700 Precedex decreased to  0.58mcg.  0826 Morphine increased to 6 mg/hr. Precedex stopped.  1610 Morphine decreased to 4 mg/hr per daughter in laws request. Patient resting well. Oxygen turned off. Plan to transfer patient to [facility]. 1100 Patient shows no signs or symptoms of distress. Daughter in law request Morphine drip decreased to 2 mg/hr. Social worker in to see family. 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 Requested per social worker to arrange transport to [facility]. Dr. Hilton Sinclair in and aware of family request. 1211 Patient became very agitated,tachypneaic,and short of breath. Family upset about patients state. Patient was given a Morphine bolus and Robinol. Morphine drip increased to 4 mg/hr. Daughter in law tearful. Plan to transport to [place] aborted and patient stays in ICU. Orders to transfer to 1C written. No 1C bed available. Family informed. 1400 Patient and family calmer. 1700 Bath done. Patient received a bolus with Morphine before bath.

## 2023-07-12 NOTE — Discharge Summary (Signed)
Physician Discharge Summary   Patient: Emily Bishop MRN: [id number] DOB: February 12, 1956  Admit date:     07/08/2023  Discharge date: 07/12/23  Discharge Physician: Alford Highland   PCP: Ailene Ravel, MD   Recommendations at discharge:    Hospice facility one day  Discharge Diagnoses: Principal Problem:   End of life care Active Problems:   Acute on chronic respiratory failure with hypoxia and hypercapnia (HCC)   COPD exacerbation (HCC)   COPD  GOLD ? / group D    Acute hypoxic respiratory failure (HCC)   Electrolyte abnormality   Acute metabolic encephalopathy   SIRS (systemic inflammatory response syndrome) (HCC)   Metabolic acidosis   Hospital Course: 66 year old female presenting to [facility] ED from home via EMS on 07/08/2023 for evaluation of worsening shortness of breath.   History provided per chart review and spouse bedside report as patient is unable to participate in interview at this time. Patient was in her normal state of health until roughly 3 days ago on 07/05/2023 when she developed some sinus congestion and shortness of breath.  Over these 3 days she has had progressive dyspnea, wheezing and spouse reports dry nonproductive cough.  Of note patient reported on arrival a productive cough with yellowish sputum. She denied chest pain on arrival, as well as denying nausea/ vomiting/ diarrhea/ abdominal pain.  Spouse denies any fever or chills stating he checked her temperature and she was about 99 degrees.  She also reported using her albuterol rescue inhaler around-the-clock with minimal help. They do not have the capability to check her SpO2 at home and the patient does not utilize home oxygen during the day or at night.  Spouse reports her baseline as " lying on the couch".  When asked if she is able to take part in activity without shortness of breath he responded that he takes care of all the cleaning and cooking and that she does not have to do any activities. He  reports that her PO diet has remained good and she has been eating and drinking well. He also reported at baseline she sleeps sitting up in a recliner chair. He states she is a former cigarette smoker, switching to vaping 3-4 years ago after she was diagnosed with COPD.  He is unclear of how much vaping the patient does during the day.  She has a history of EtOH abuse, however has reported cutting back and has been confirms she has 2-6 beers a day but has abstained since Friday, 07/05/2023 due to not feeling well.  He denies any withdrawal symptoms in the past when she has abstained from drinking EtOH.  He denies any recreational drug use. She was last seen by pulmonology outpatient in August 2023 at that time she was evaluated for home oxygen and was not a candidate.  ED course: Upon arrival patient alert and awake diminished breath sounds and expiratory wheezing.  SpO2 low to mid 90s on room air, temperature 99.6 with tachycardia and hypertension. COVID/flu/RSV negative, chest x-ray negative for acute infiltrate.  Labs significant for hypokalemia, NAGMA, hypocalcemia & hypoalbuminemia. TRH consulted for admission to inpatient.  Hospital course: Overnight patient with worsening shortness of breath, tripoding, anxious in respiratory distress.  She was given several DuoNeb treatments and Ativan, placed on BiPAP for support and transferred to the ICU. PCCM consulted for assistance in management and monitoring due to acute hypoxic respiratory failure secondary to AECOPD with a high risk for intubation and mechanical ventilatory support.  Patient  was made comfort care measures on 12/6 and started on morphine drip.  Patient seen on 12/7 and family was interested in hospice home.  Assessment and Plan: * End of life care Patient was made comfort care on 12/6 and started on morphine drip.  Patient is unresponsive on morphine drip.  Family is interested in hospice facility in [place].  Family member who is a  hospice nurse wants to try to get her to [place].  With a longer trip the patient may pass away in route.  Family is willing to take the risk of a trip.  Acute on chronic respiratory failure with hypoxia and hypercapnia (HCC) Since patient made comfort care measures will continue oxygen via nasal cannula.  COPD exacerbation (HCC) Initially patient was started on antibiotics and Solu-Medrol and given nebulizer treatments.  Haemophilus influenzae growing out of sputum culture  Metabolic acidosis On admission  SIRS (systemic inflammatory response syndrome) (HCC) Present on admission with COPD exacerbation.  Acute metabolic encephalopathy Patient had altered mental status before more TPN drip started.  Electrolyte abnormality Numerous electrolyte abnormalities with hyponatremia, hypokalemia than hyperkalemia, hypocalcemia.         Consultants: critical care specialist Procedures performed: none Disposition: hospice facility Diet recommendation:  npo DISCHARGE MEDICATION: Allergies as of 07/12/2023       Reactions   Penicillins Anaphylaxis, Swelling, Other (See Comments)   Tongue swells Has patient had a PCN reaction causing immediate rash, facial/tongue/throat swelling, SOB or lightheadedness with hypotension: Y Has patient had a PCN reaction causing severe rash involving mucus membranes or skin necrosis: Unknown Has patient had a PCN reaction that required hospitalization: No Has patient had a PCN reaction occurring within the last 10 years: No If all of the above answers are "NO", then may proceed with Cephalosporin use.        Medication List     STOP taking these medications    albuterol (2.5 MG/3ML) 0.083% nebulizer solution Commonly known as: PROVENTIL   albuterol 108 (90 Base) MCG/ACT inhaler Commonly known as: VENTOLIN HFA   atorvastatin 10 MG tablet Commonly known as: LIPITOR   Breztri Aerosphere 160-9-4.8 MCG/ACT Aero Generic drug:  Budeson-Glycopyrrol-Formoterol   busPIRone 15 MG tablet Commonly known as: BUSPAR   escitalopram 10 MG tablet Commonly known as: LEXAPRO   fluticasone 50 MCG/ACT nasal spray Commonly known as: FLONASE   ipratropium-albuterol 0.5-2.5 (3) MG/3ML Soln Commonly known as: DUONEB   losartan 50 MG tablet Commonly known as: COZAAR   multivitamin tablet   traZODone 50 MG tablet Commonly known as: DESYREL       TAKE these medications    glycopyrrolate 0.2 MG/ML injection Commonly known as: ROBINUL Inject 1 mL (0.2 mg total) into the vein every 4 (four) hours as needed (excessive secretions).   LORazepam 2 MG/ML injection Commonly known as: ATIVAN Inject 0.5 mLs (1 mg total) into the vein every 4 (four) hours as needed (agitation).   morphine 1 mg/mL Soln infusion Inject 2 mg/hr into the vein continuous.   mouth rinse Liqd solution 15 mLs by Mouth Rinse route every 2 (two) hours.   ondansetron 4 MG/2ML Soln injection Commonly known as: ZOFRAN Inject 2 mLs (4 mg total) into the vein every 6 (six) hours as needed for nausea.   sodium chloride flush 0.9 % Soln Commonly known as: NS 10-40 mLs by Intracatheter route every 12 (twelve) hours.        Follow-up Information     hospice facility one day Follow up  in 1 day(s).                 Discharge Exam: Filed Weights   07/09/23 0300 07/10/23 0359 07/11/23 0500  Weight: 64.8 kg 66.7 kg 67.5 kg   Physical Exam HENT:     Head: Normocephalic.  Eyes:     General: Lids are normal.     Conjunctiva/sclera: Conjunctivae normal.  Cardiovascular:     Rate and Rhythm: Regular rhythm. Tachycardia present.     Heart sounds: Normal heart sounds, S1 normal and S2 normal.  Pulmonary:     Breath sounds: Examination of the right-lower field reveals decreased breath sounds. Examination of the left-lower field reveals decreased breath sounds. Decreased breath sounds present. No wheezing, rhonchi or rales.  Abdominal:      Palpations: Abdomen is soft.     Tenderness: There is no abdominal tenderness.  Musculoskeletal:     Right lower leg: No swelling.     Left lower leg: No swelling.  Skin:    General: Skin is warm.  Neurological:     Mental Status: She is lethargic.      Condition at discharge: Guarded  The results of significant diagnostics from this hospitalization (including imaging, microbiology, ancillary and laboratory) are listed below for reference.   Imaging Studies: DG Abd Portable 1V  Result Date: 07/11/2023 CLINICAL DATA:  67 year old female enteric tube placement. EXAM: PORTABLE ABDOMEN - 1 VIEW COMPARISON:  07/09/2023. FINDINGS: Portable AP semi upright view at 0916 hours. Small volume of oral contrast in the stomach. Enteric tube position compatible with gastric ptosis. Side hole at the level of the gastric body. Negative lung bases. Normal visible bowel gas pattern. Aortoiliac calcified atherosclerosis. IMPRESSION: Satisfactory enteric tube placement.  Evidence of gastric ptosis. Electronically Signed   By: Odessa Fleming M.D.   On: 07/11/2023 10:06   [place] EKG SITE RITE  Result Date: 07/10/2023 If Site Rite image not attached, placement could not be confirmed due to current cardiac rhythm.  DG Chest Port 1 View  Result Date: 07/10/2023 CLINICAL DATA:  67 year old female with respiratory failure. EXAM: PORTABLE CHEST 1 VIEW COMPARISON:  Portable chest yesterday and earlier. FINDINGS: Portable AP semi upright views at 0509 hours. Intubated. Endotracheal tube tip in good position between the clavicles and carina. Enteric tube side hole at the level of the stomach. Small volume oral contrast in the stomach. Normal to mildly hyperinflated lung volumes. Allowing for portable technique the lungs are clear. No pneumothorax. No acute osseous abnormality identified. IMPRESSION: 1. Satisfactory ET tube and enteric tube. 2. Large lung volumes with no acute cardiopulmonary abnormality. Electronically Signed   By:  Odessa Fleming M.D.   On: 07/10/2023 08:31   DG Abd 1 View  Result Date: 07/09/2023 CLINICAL DATA:  67 year old female with shortness of breath, COPD. Intubated. EXAM: ABDOMEN - 1 VIEW COMPARISON:  Portable chest x-ray at the same time reported separately. FINDINGS: Portable AP supine view at 0641 hours. Enteric tube placed into the epigastrium, stomach. Side hole at the level of the gastric body. Nonobstructed visible bowel gas pattern. Aortoiliac calcified atherosclerosis. IMPRESSION: 1. Satisfactory enteric tube placement into the stomach. 2.  Aortic Atherosclerosis (ICD10-I70.0). Electronically Signed   By: Odessa Fleming M.D.   On: 07/09/2023 07:07   DG Chest Port 1 View  Result Date: 07/09/2023 CLINICAL DATA:  67 year old female with shortness of breath, COPD. Intubated. EXAM: PORTABLE CHEST 1 VIEW COMPARISON:  Portable chest 0159 hours today and earlier. FINDINGS: Portable AP supine  view at 0641 hours. Endotracheal tube tip in good position between the level the clavicles and carina. Enteric tube courses to the abdomen. Large lung volumes. No pneumothorax or pulmonary edema. No pleural effusion or consolidation but there is coarse left lower lung reticulonodular opacity which appears increased from 07/08/2023. Calcified aortic atherosclerosis. Stable cardiac size and mediastinal contours. Stable visualized osseous structures. IMPRESSION: 1. Satisfactory endotracheal tube. Enteric tube courses to the abdomen. 2. Emphysema (ICD10-J43.9) with acutely increased left lower lung reticulonodular opacity suspicious for acute infectious exacerbation. No pleural effusion. Electronically Signed   By: Odessa Fleming M.D.   On: 07/09/2023 07:06   DG Chest Port 1 View  Result Date: 07/09/2023 CLINICAL DATA:  Dyspnea and respiratory abnormalities. History of COPD, hypertension, former smoker. EXAM: PORTABLE CHEST 1 VIEW COMPARISON:  07/08/2023 FINDINGS: Heart size and pulmonary vascularity are normal. Emphysematous changes in the  lungs. Bronchiectasis and peribronchial thickening suggesting chronic bronchitis. No airspace disease or consolidation in the lungs. No pleural effusions. No pneumothorax. Mediastinal contours appear intact. Calcification of the aorta. IMPRESSION: Emphysematous and chronic bronchitic changes in the lungs. No evidence of active pulmonary disease. Electronically Signed   By: Burman Nieves M.D.   On: 07/09/2023 02:56   DG Chest 2 View  Result Date: 07/08/2023 CLINICAL DATA:  SOB, cough EXAM: CHEST - 2 VIEW COMPARISON:  Chest radiograph dated May 23, 2021 FINDINGS: The heart size and mediastinal contours are within normal limits. Aortic atherosclerosis. Hyperinflation with upper lobe predominant emphysematous changes, compatible with history of COPD. No focal consolidation. No pleural effusion or pneumothorax. No acute osseous abnormality. IMPRESSION: Hyperinflation with upper lobe predominant emphysema, compatible with history of COPD. No focal consolidation. Electronically Signed   By: Hart Robinsons M.D.   On: 07/08/2023 12:35    Microbiology: Results for orders placed or performed during the hospital encounter of 07/08/23  Resp panel by RT-PCR (RSV, Flu A&B, Covid) Anterior Nasal Swab     Status: None   Collection Time: 07/08/23 11:46 AM   Specimen: Anterior Nasal Swab  Result Value Ref Range Status   SARS Coronavirus 2 by RT PCR NEGATIVE NEGATIVE Final    Comment: (NOTE) SARS-CoV-2 target nucleic acids are NOT DETECTED.  The SARS-CoV-2 RNA is generally detectable in upper respiratory specimens during the acute phase of infection. The lowest concentration of SARS-CoV-2 viral copies this assay can detect is 138 copies/mL. A negative result does not preclude SARS-Cov-2 infection and should not be used as the sole basis for treatment or other patient management decisions. A negative result may occur with  improper specimen collection/handling, submission of specimen other than  nasopharyngeal swab, presence of viral mutation(s) within the areas targeted by this assay, and inadequate number of viral copies(<138 copies/mL). A negative result must be combined with clinical observations, patient history, and epidemiological information. The expected result is Negative.  Fact Sheet for Patients:  BloggerCourse.com  Fact Sheet for Healthcare Providers:  SeriousBroker.it  This test is no t yet approved or cleared by the [place] FDA and  has been authorized for detection and/or diagnosis of SARS-CoV-2 by FDA under an Emergency Use Authorization (EUA). This EUA will remain  in effect (meaning this test can be used) for the duration of the COVID-19 declaration under Section 564(b)(1) of the Act, 21 U.S.C.section 360bbb-3(b)(1), unless the authorization is terminated  or revoked sooner.       Influenza A by PCR NEGATIVE NEGATIVE Final   Influenza B by PCR NEGATIVE NEGATIVE Final  Comment: (NOTE) The Xpert Xpress SARS-CoV-2/FLU/RSV plus assay is intended as an aid in the diagnosis of influenza from Nasopharyngeal swab specimens and should not be used as a sole basis for treatment. Nasal washings and aspirates are unacceptable for Xpert Xpress SARS-CoV-2/FLU/RSV testing.  Fact Sheet for Patients: BloggerCourse.com  Fact Sheet for Healthcare Providers: SeriousBroker.it  This test is not yet approved or cleared by the [place] FDA and has been authorized for detection and/or diagnosis of SARS-CoV-2 by FDA under an Emergency Use Authorization (EUA). This EUA will remain in effect (meaning this test can be used) for the duration of the COVID-19 declaration under Section 564(b)(1) of the Act, 21 U.S.C. section 360bbb-3(b)(1), unless the authorization is terminated or revoked.     Resp Syncytial Virus by PCR NEGATIVE NEGATIVE Final    Comment:  (NOTE) Fact Sheet for Patients: BloggerCourse.com  Fact Sheet for Healthcare Providers: SeriousBroker.it  This test is not yet approved or cleared by the [place] FDA and has been authorized for detection and/or diagnosis of SARS-CoV-2 by FDA under an Emergency Use Authorization (EUA). This EUA will remain in effect (meaning this test can be used) for the duration of the COVID-19 declaration under Section 564(b)(1) of the Act, 21 U.S.C. section 360bbb-3(b)(1), unless the authorization is terminated or revoked.  Performed at [facility], [address] [place], [place] [address]   MRSA Next Gen by PCR, Nasal     Status: None   Collection Time: 07/09/23  2:39 AM   Specimen: Nasal Mucosa; Nasal Swab  Result Value Ref Range Status   MRSA by PCR Next Gen NOT DETECTED NOT DETECTED Final    Comment: (NOTE) The GeneXpert MRSA Assay (FDA approved for NASAL specimens only), is one component of a comprehensive MRSA colonization surveillance program. It is not intended to diagnose MRSA infection nor to guide or monitor treatment for MRSA infections. Test performance is not FDA approved in patients less than 40 years old. Performed at [facility], [address] [place], [place] [address]   Culture, Respiratory w Gram Stain     Status: None   Collection Time: 07/09/23  9:50 AM   Specimen: Tracheal Aspirate; Respiratory  Result Value Ref Range Status   Specimen Description   Final    TRACHEAL ASPIRATE Performed at [facility], [address], [place], [place] [address]    Special Requests   Final    NONE Performed at [facility], [address] [place], [place] [address]    Gram Stain   Final    RARE WBC PRESENT, PREDOMINANTLY PMN FEW GRAM POSITIVE COCCI IN PAIRS RARE GRAM NEGATIVE RODS    Culture   Final    ABUNDANT HAEMOPHILUS INFLUENZAE BETA LACTAMASE POSITIVE Performed at  [facility], [address], [place], [place] [address]    Report Status 07/10/2023 FINAL  Final  Culture, blood (Routine X 2) w Reflex to ID Panel     Status: None (Preliminary result)   Collection Time: 07/09/23 10:55 AM   Specimen: BLOOD  Result Value Ref Range Status   Specimen Description BLOOD BLOOD LEFT ARM  Final   Special Requests   Final    BOTTLES DRAWN AEROBIC AND ANAEROBIC Blood Culture adequate volume   Culture   Final    NO GROWTH 3 DAYS Performed at [facility], [address], [place], [place] [address]    Report Status PENDING  Incomplete  Culture, blood (Routine X 2)  w Reflex to ID Panel     Status: None (Preliminary result)   Collection Time: 07/09/23 11:08 AM   Specimen: BLOOD  Result Value Ref Range Status   Specimen Description BLOOD BLOOD LEFT HAND  Final   Special Requests   Final    BOTTLES DRAWN AEROBIC AND ANAEROBIC Blood Culture results may not be optimal due to an inadequate volume of blood received in culture bottles   Culture   Final    NO GROWTH 3 DAYS Performed at [facility], [address] [place], [place] [address]    Report Status PENDING  Incomplete  Respiratory (~20 pathogens) panel by PCR     Status: None   Collection Time: 07/10/23  1:17 PM   Specimen: Nasopharyngeal Swab; Respiratory  Result Value Ref Range Status   Adenovirus NOT DETECTED NOT DETECTED Final   Coronavirus 229E NOT DETECTED NOT DETECTED Final    Comment: (NOTE) The Coronavirus on the Respiratory Panel, DOES NOT test for the novel  Coronavirus (2019 nCoV)    Coronavirus HKU1 NOT DETECTED NOT DETECTED Final   Coronavirus NL63 NOT DETECTED NOT DETECTED Final   Coronavirus OC43 NOT DETECTED NOT DETECTED Final   Metapneumovirus NOT DETECTED NOT DETECTED Final   Rhinovirus / Enterovirus NOT DETECTED NOT DETECTED Final   Influenza A NOT DETECTED NOT DETECTED Final   Influenza B NOT DETECTED NOT DETECTED Final   Parainfluenza Virus 1 NOT  DETECTED NOT DETECTED Final   Parainfluenza Virus 2 NOT DETECTED NOT DETECTED Final   Parainfluenza Virus 3 NOT DETECTED NOT DETECTED Final   Parainfluenza Virus 4 NOT DETECTED NOT DETECTED Final   Respiratory Syncytial Virus NOT DETECTED NOT DETECTED Final   Bordetella pertussis NOT DETECTED NOT DETECTED Final   Bordetella Parapertussis NOT DETECTED NOT DETECTED Final   Chlamydophila pneumoniae NOT DETECTED NOT DETECTED Final   Mycoplasma pneumoniae NOT DETECTED NOT DETECTED Final    Comment: Performed at [facility], [address], [place], [place] [address]    Labs: CBC: Recent Labs  Lab 07/08/23 1146 07/09/23 0229 07/10/23 0437 07/11/23 0413  WBC 7.8 11.5* 12.3* 10.3  HGB 11.6* 12.3 10.6* 9.8*  HCT 33.2* 35.5* 31.9* 29.3*  MCV 94.6 92.4 97.0 96.4  PLT 253 428* 319 266   Basic Metabolic Panel: Recent Labs  Lab 07/08/23 1146 07/08/23 2130 07/09/23 0229 07/09/23 0618 07/09/23 1055 07/09/23 2150 07/10/23 0437 07/11/23 0413  NA 136 127* 128*  --   --   --  129* 132*  K 2.1* 5.5* 6.1* 5.8* 5.3* 5.0 4.6 5.0  CL 113* 92* 93*  --   --   --  97* 99  CO2 13* 24 24  --   --   --  24 24  GLUCOSE 89 184* 194*  --   --   --  185* 128*  BUN <5* 9 11  --   --   --  22 33*  CREATININE 0.34* 0.63 0.53  --   --   --  0.72 0.69  CALCIUM 5.5* 9.2 9.1  --   --   --  8.9 8.4*  MG  --  2.6* 2.6*  --   --   --  2.7* 2.5*  PHOS  --  3.5  --   --   --   --  4.0 4.4   Liver Function Tests: Recent Labs  Lab 07/08/23 1146 07/09/23 0229 07/10/23 0437 07/11/23 0413  AST 12* 34  --   --  ALT 10 20  --   --   ALKPHOS 52 96  --   --   BILITOT 0.6 0.9  --   --   PROT 4.2* 7.8  --   --   ALBUMIN 2.2* 3.8 3.2* 2.8*   CBG: Recent Labs  Lab 07/10/23 1947 07/10/23 2334 07/11/23 0406 07/11/23 0759 07/11/23 1118  GLUCAP 161* 177* 112* 167* 119*    Discharge time spent: greater than 30 minutes.  Signed: Alford Highland, MD [facility] 07/12/2023

## 2023-07-12 NOTE — Assessment & Plan Note (Signed)
Since patient made comfort care measures, oxygen as needed for comfort.  Last pulse ox in the 70s.

## 2023-07-12 NOTE — Assessment & Plan Note (Signed)
Patient had altered mental status before morphine drip started.

## 2023-07-13 DIAGNOSIS — J9601 Acute respiratory failure with hypoxia: Secondary | ICD-10-CM | POA: Diagnosis not present

## 2023-07-13 DIAGNOSIS — J9621 Acute and chronic respiratory failure with hypoxia: Secondary | ICD-10-CM | POA: Diagnosis not present

## 2023-07-13 DIAGNOSIS — G9341 Metabolic encephalopathy: Secondary | ICD-10-CM | POA: Diagnosis not present

## 2023-07-13 DIAGNOSIS — Z515 Encounter for palliative care: Secondary | ICD-10-CM | POA: Diagnosis not present

## 2023-07-13 MED ORDER — HYDROMORPHONE HCL-NACL 50-0.9 MG/50ML-% IV SOLN
1.0000 mg/h | INTRAVENOUS | Status: DC
  Administered 2023-07-13: 1 mg/h via INTRAVENOUS
  Administered 2023-07-14: 1.5 mg/h via INTRAVENOUS
  Filled 2023-07-13 (×2): qty 50

## 2023-07-13 NOTE — Progress Notes (Signed)
Family request for patient's medication to be changed from the morphine gtt to dilaudid gtt, MD notified of request.

## 2023-07-13 NOTE — Plan of Care (Signed)
Continuing with plan of care. 

## 2023-07-13 NOTE — Progress Notes (Signed)
Spoke with patient's spouse and son regarding desire to be present at patient's bedside during passing.  Spouse and son will be back in the morning and in the event the patient passes before then spouse and son would both like to be contacted.

## 2023-07-13 NOTE — Progress Notes (Signed)
Progress Note   Patient: Emily Bishop YQM:[id number] DOB: 11-22-1955 DOA: 07/08/2023     5 DOS: the patient was seen and examined on 07/13/2023   Brief hospital course: 67 year old female presenting to [facility] ED from home via EMS on 07/08/2023 for evaluation of worsening shortness of breath.   History provided per chart review and spouse bedside report as patient is unable to participate in interview at this time. Patient was in her normal state of health until roughly 3 days ago on 07/05/2023 when she developed some sinus congestion and shortness of breath.  Over these 3 days she has had progressive dyspnea, wheezing and spouse reports dry nonproductive cough.  Of note patient reported on arrival a productive cough with yellowish sputum. She denied chest pain on arrival, as well as denying nausea/ vomiting/ diarrhea/ abdominal pain.  Spouse denies any fever or chills stating he checked her temperature and she was about 99 degrees.  She also reported using her albuterol rescue inhaler around-the-clock with minimal help. They do not have the capability to check her SpO2 at home and the patient does not utilize home oxygen during the day or at night.  Spouse reports her baseline as " lying on the couch".  When asked if she is able to take part in activity without shortness of breath he responded that he takes care of all the cleaning and cooking and that she does not have to do any activities. He reports that her PO diet has remained good and she has been eating and drinking well. He also reported at baseline she sleeps sitting up in a recliner chair. He states she is a former cigarette smoker, switching to vaping 3-4 years ago after she was diagnosed with COPD.  He is unclear of how much vaping the patient does during the day.  She has a history of EtOH abuse, however has reported cutting back and has been confirms she has 2-6 beers a day but has abstained since Friday, 07/05/2023 due to not feeling well.   He denies any withdrawal symptoms in the past when she has abstained from drinking EtOH.  He denies any recreational drug use. She was last seen by pulmonology outpatient in August 2023 at that time she was evaluated for home oxygen and was not a candidate.  ED course: Upon arrival patient alert and awake diminished breath sounds and expiratory wheezing.  SpO2 low to mid 90s on room air, temperature 99.6 with tachycardia and hypertension. COVID/flu/RSV negative, chest x-ray negative for acute infiltrate.  Labs significant for hypokalemia, NAGMA, hypocalcemia & hypoalbuminemia. TRH consulted for admission to inpatient.  Hospital course: Overnight patient with worsening shortness of breath, tripoding, anxious in respiratory distress.  She was given several DuoNeb treatments and Ativan, placed on BiPAP for support and transferred to the ICU. PCCM consulted for assistance in management and monitoring due to acute hypoxic respiratory failure secondary to AECOPD with a high risk for intubation and mechanical ventilatory support.  Patient was made comfort care measures on 12/6 and started on morphine drip.  Patient seen on 12/7 and family was initially interested in hospice home but changed their mind to have continue treatment in the hospital.  12/8.  Patient on morphine drip 10 mg/h and as needed medications for comfort care.  Spoke with family at the bedside and answered questions.  Assessment and Plan: * End of life care Patient was made comfort care on 12/6 and started on morphine drip.  Patient is unresponsive on morphine  drip.  As needed glycopyrrolate needed to be given for upper airway congestion and gurgling.  Acute on chronic respiratory failure with hypoxia and hypercapnia (HCC) Since patient made comfort care measures, oxygen as needed for comfort.  Last pulse ox in the 70s.  COPD exacerbation (HCC) Initially patient was started on antibiotics and Solu-Medrol and given nebulizer  treatments.  Haemophilus influenzae growing out of sputum culture.  Now comfort care measures.  Metabolic acidosis On admission  SIRS (systemic inflammatory response syndrome) (HCC) Present on admission with COPD exacerbation.  Acute metabolic encephalopathy Patient had altered mental status before morphine drip started.  Electrolyte abnormality Numerous electrolyte abnormalities with hyponatremia, hypokalemia than hyperkalemia, hypocalcemia.        Subjective: Patient early morning and again late morning.  Patient had some gurgling on secretions.  Unresponsive.  Continue comfort care measures.  Initially admitted with wheezing cough and shortness of breath.  Physical Exam: Vitals:   07/13/23 0820 07/13/23 0900 07/13/23 1000 07/13/23 1100  BP:  (!) 146/64 (!) 154/60 130/65  Pulse: (!) 126 (!) 130 (!) 124 (!) 121  Resp: (!) 21 14 19 19   Temp:      TempSrc:      SpO2: (!) 72% (!) 78% (!) 74% (!) 79%  Weight:      Height:       Physical Exam HENT:     Head: Normocephalic.  Eyes:     General: Lids are normal.     Conjunctiva/sclera: Conjunctivae normal.  Cardiovascular:     Rate and Rhythm: Regular rhythm. Tachycardia present.     Heart sounds: Normal heart sounds, S1 normal and S2 normal.  Pulmonary:     Breath sounds: Transmitted upper airway sounds present. Examination of the right-lower field reveals decreased breath sounds and rhonchi. Examination of the left-lower field reveals decreased breath sounds and rhonchi. Decreased breath sounds and rhonchi present. No wheezing or rales.  Abdominal:     Palpations: Abdomen is soft.     Tenderness: There is no abdominal tenderness.  Musculoskeletal:     Right lower leg: No swelling.     Left lower leg: No swelling.  Skin:    General: Skin is warm.  Neurological:     Mental Status: She is lethargic.     Data Reviewed: No new data with comfort care measures  Family Communication: Spoke with husband and son at the  bedside  Disposition: Status is: Inpatient Remains inpatient appropriate because: Continue full comfort care measures.  Patient is a DNR  Planned Discharge Destination: Patient will remain here for comfort care measures    Time spent: 28 minutes Case discussed with nursing staff  Author: Alford Highland, MD 07/13/2023 2:24 PM  For on call review [email or web address].

## 2023-07-14 DIAGNOSIS — J9622 Acute and chronic respiratory failure with hypercapnia: Secondary | ICD-10-CM | POA: Diagnosis not present

## 2023-07-14 DIAGNOSIS — Z515 Encounter for palliative care: Secondary | ICD-10-CM | POA: Diagnosis not present

## 2023-07-14 DIAGNOSIS — I9589 Other hypotension: Secondary | ICD-10-CM

## 2023-07-14 DIAGNOSIS — J441 Chronic obstructive pulmonary disease with (acute) exacerbation: Secondary | ICD-10-CM | POA: Diagnosis not present

## 2023-07-14 DIAGNOSIS — G9341 Metabolic encephalopathy: Secondary | ICD-10-CM | POA: Diagnosis not present

## 2023-07-14 DIAGNOSIS — J9601 Acute respiratory failure with hypoxia: Secondary | ICD-10-CM | POA: Diagnosis not present

## 2023-07-14 DIAGNOSIS — I959 Hypotension, unspecified: Secondary | ICD-10-CM | POA: Insufficient documentation

## 2023-07-14 DIAGNOSIS — J9621 Acute and chronic respiratory failure with hypoxia: Secondary | ICD-10-CM | POA: Diagnosis not present

## 2023-07-14 LAB — CULTURE, BLOOD (ROUTINE X 2)
Culture: NO GROWTH
Culture: NO GROWTH
Special Requests: ADEQUATE

## 2023-07-14 MED ORDER — HYDROMORPHONE HCL-NACL 50-0.9 MG/50ML-% IV SOLN: 0.5000 mg/h | INTRAVENOUS | Status: DC

## 2023-07-14 MED ORDER — HYDROMORPHONE BOLUS VIA INFUSION: 0.2500 mg | INTRAVENOUS | Status: DC | PRN

## 2023-07-14 MED ORDER — GLYCOPYRROLATE 0.2 MG/ML IJ SOLN
0.2000 mg | INTRAMUSCULAR | Status: DC
  Administered 2023-07-14 (×3): 0.2 mg via INTRAVENOUS
  Filled 2023-07-14 (×3): qty 1

## 2023-08-06 NOTE — Progress Notes (Signed)
Patient moved to room 103 at this time. Family at bedside and notified. Stated they were leaving but would be back later, call any time if patient passes. Patient unresponsive at this time.

## 2023-08-06 NOTE — Consult Note (Signed)
Consultation Note Date: 07/24/2023   Patient Name: Emily Bishop  DOB: Jul 29, 1956  MRN: [id number]  Age / Sex: 68 y.o., female  PCP: Ailene Ravel, MD Referring Physician: Alford Highland, MD  Reason for Consultation: end of life care  HPI/Patient Profile: 68 y.o. female  with past medical history of  COPD, HTN, HLD, admitted on 07/08/2023 with shortness of breath, COPD, hypokalemia, hypocalcemia, suspected pneumonia. Status worsened during admission, she did not tolerate bipap, and she was intubated on 12.4.  Extubated on 12/6 and transitioned to comfort measures only that same day. Palliative medicine consulted for assistance with end of life care.   Primary Decision Maker NEXT OF KIN - spouse  Discussion: Chart reviewed including labs, progress notes, imaging from this and previous encounters.  Evaluated patient. Her spouse, son and daughter in law were at bedside.  Patient comfortable.  Daughter in law is a hospice nurse and noted patient was twitching on morphine infusion- concerns for opioid neurotoxicity- and appropriately requested to change infusion from morphine to hydromorphone. No further twitching noted. Hydromorphone currently infusion at 1.5mg /hr. No bolus doses have been given.  Significant terminal secretions present.  Appears comfortable.  Family would like  patient transferred out of ICU when bed is available.  Prognosis discussed- likely hours-days. I would not be surprised if she was no longer with [place] when I arrive tomorrow morning.     SUMMARY OF RECOMMENDATIONS -Continue current interventions- please use hydormorphone bolus for air hunger prior to increasing infusion rate -Terminal secretions- start robinul 0.2mg  q4hr IV- can increase to 0.4 if secretions continue to be uncontrolled -Avoid morphine due to likely opioid neurotoxicity -Transfer to regular floor when bed is  available -Not stable for discharge to outside facility     Code Status/Advance Care Planning: DNR   Prognosis:   Hours - Days  Discharge Planning: Anticipated Hospital Death  Primary Diagnoses: Present on Admission:  COPD  GOLD ? / group D   Acute hypoxic respiratory failure (HCC)  COPD exacerbation (HCC)   Review of Systems  Unable to perform ROS: Acuity of condition    Physical Exam Vitals and nursing note reviewed.  Constitutional:      General: She is not in acute distress.    Appearance: She is ill-appearing.  Cardiovascular:     Rate and Rhythm: Tachycardia present.     Comments: Weak peripheral pulses Pulmonary:     Effort: Pulmonary effort is normal.     Comments: Terminal secretions Skin:    General: Skin is warm and dry.     Comments: ashen  Neurological:     Comments: unresponsive     Vital Signs: BP (!) 73/61   Pulse (!) 136   Temp 98.6 F (37 C) (Oral)   Resp 17   Ht 5' 5.98" (1.676 m)   Wt 67.5 kg   SpO2 (!) 63%   BMI 24.03 kg/m  Pain Scale: CPOT   Pain Score: Asleep   SpO2: SpO2: (!) 63 % O2 Device:SpO2: Marland Kitchen)  63 % O2 Flow Rate: .O2 Flow Rate (L/min): 2 L/min  IO: Intake/output summary:  Intake/Output Summary (Last 24 hours) at 07/09/2023 1141 Last data filed at 07/28/2023 0900 Gross per 24 hour  Intake 106.49 ml  Output 980 ml  Net -873.51 ml    LBM: Last BM Date : 07/09/23 Baseline Weight: Weight: 61 kg Most recent weight: Weight: 67.5 kg       Thank you for this consult. Palliative medicine will continue to follow and assist as needed.  Time Total: 60 minutes Signed by: Ocie Bob, AGNP-C Palliative Medicine  Time includes:   Preparing to see the patient (e.g., review of tests) Obtaining and/or reviewing separately obtained history Performing a medically necessary appropriate examination and/or evaluation Counseling and educating the patient/family/caregiver Ordering medications, tests, or procedures Referring  and communicating with other health care professionals (when not reported separately) Documenting clinical information in the electronic or other health record Independently interpreting results (not reported separately) and communicating results to the patient/family/caregiver Care coordination (not reported separately) Clinical documentation   Please contact Palliative Medicine Team phone at [phone] for questions and concerns.  For individual provider: See Loretha Stapler

## 2023-08-06 NOTE — Death Summary Note (Addendum)
DEATH SUMMARY   Patient Details  Name: Emily Bishop MRN: [id number] DOB: 11-26-1955 [id number], Emily Fortes, MD Admission/Discharge Information   Admit Date:  07/24/2023  Date of Death: Date of Death: 30-Jul-2023  Time of Death: Time of Death: August 04, 2234  Length of Stay: 7   Principle Cause of death: Acute on chronic respiratory failure with hypoxia and hypercapnea  Hospital Diagnoses: Principal Problem:   End of life care Active Problems:   Acute on chronic respiratory failure with hypoxia and hypercapnia (HCC)   COPD exacerbation (HCC)   COPD  GOLD ? / group D    Electrolyte abnormality   Acute metabolic encephalopathy   SIRS (systemic inflammatory response syndrome) (HCC)   Metabolic acidosis   Hypotension   Hospital Course: 68 year old female presenting to [facility] ED from home via EMS on 2023/07/24 for evaluation of worsening shortness of breath.   History provided per chart review and spouse bedside report as patient is unable to participate in interview at this time. Patient was in her normal state of health until roughly 3 days ago on 07/05/2023 when she developed some sinus congestion and shortness of breath.  Over these 3 days she has had progressive dyspnea, wheezing and spouse reports dry nonproductive cough.  Of note patient reported on arrival a productive cough with yellowish sputum. She denied chest pain on arrival, as well as denying nausea/ vomiting/ diarrhea/ abdominal pain.  Spouse denies any fever or chills stating he checked her temperature and she was about 99 degrees.  She also reported using her albuterol rescue inhaler around-the-clock with minimal help. They do not have the capability to check her SpO2 at home and the patient does not utilize home oxygen during the day or at night.  Spouse reports her baseline as " lying on the couch".  When asked if she is able to take part in activity without shortness of breath he responded that he takes care of all the cleaning  and cooking and that she does not have to do any activities. He reports that her PO diet has remained good and she has been eating and drinking well. He also reported at baseline she sleeps sitting up in a recliner chair. He states she is a former cigarette smoker, switching to vaping 3-4 years ago after she was diagnosed with COPD.  He is unclear of how much vaping the patient does during the day.  She has a history of EtOH abuse, however has reported cutting back and has been confirms she has 2-6 beers a day but has abstained since Friday, 07/05/2023 due to not feeling well.  He denies any withdrawal symptoms in the past when she has abstained from drinking EtOH.  He denies any recreational drug use. She was last seen by pulmonology outpatient in August 2023 at that time she was evaluated for home oxygen and was not a candidate.  ED course: Upon arrival patient alert and awake diminished breath sounds and expiratory wheezing.  SpO2 low to mid 90s on room air, temperature 99.6 with tachycardia and hypertension. COVID/flu/RSV negative, chest x-ray negative for acute infiltrate.  Labs significant for hypokalemia, NAGMA, hypocalcemia & hypoalbuminemia. TRH consulted for admission to inpatient.  Hospital course: Overnight patient with worsening shortness of breath, tripoding, anxious in respiratory distress.  She was given several DuoNeb treatments and Ativan, placed on BiPAP for support and transferred to the ICU. PCCM consulted for assistance in management and monitoring due to acute hypoxic respiratory failure secondary to AECOPD with  a high risk for intubation and mechanical ventilatory support. Intubated on 12/4 and extubated for comfort care measures.  Patient was made comfort care measures on 12/6 and started on morphine drip.  Patient seen on 12/7 and family was initially interested in hospice home but changed their mind to have continue treatment in the hospital.  12/8.  Patient on morphine drip 10  mg/h and as needed medications for comfort care.  Spoke with family at the bedside and answered questions. 07-26-2023.  This morning was on Dilaudid drip 1.5 mg/h.  Continue comfort care measures.   Patient passed away on 26-Jul-2023 at 22:35.  Assessment and Plan: * End of life care Patient was made comfort care on 12/6 and started on morphine drip.  Patient changed over to Dilaudid drip on 12/8 in the evening.  Patient passed away on July 26, 2023 at 22:35.  Acute on chronic respiratory failure with hypoxia and hypercapnia [facility]) Patient made comfort care measures, oxygen as needed.  COPD exacerbation (HCC) Initially patient was started on antibiotics and Solu-Medrol and given nebulizer treatments.  Haemophilus influenzae growing out of sputum culture.  Hypotension .  Metabolic acidosis On admission  SIRS (systemic inflammatory response syndrome) (HCC) Present on admission with COPD exacerbation.  Acute metabolic encephalopathy Patient had altered mental status before comfort care measures.  Electrolyte abnormality Numerous electrolyte abnormalities with hyponatremia, hypokalemia than hyperkalemia, hypocalcemia.         Procedures: Intubation and extubation  Consultations: critical care team  The results of significant diagnostics from this hospitalization (including imaging, microbiology, ancillary and laboratory) are listed below for reference.   Significant Diagnostic Studies: DG Abd Portable 1V  Result Date: 07/11/2023 CLINICAL DATA:  67 year old female enteric tube placement. EXAM: PORTABLE ABDOMEN - 1 VIEW COMPARISON:  07/09/2023. FINDINGS: Portable AP semi upright view at 0916 hours. Small volume of oral contrast in the stomach. Enteric tube position compatible with gastric ptosis. Side hole at the level of the gastric body. Negative lung bases. Normal visible bowel gas pattern. Aortoiliac calcified atherosclerosis. IMPRESSION: Satisfactory enteric tube placement.  Evidence of  gastric ptosis. Electronically Signed   By: Odessa Fleming M.D.   On: 07/11/2023 10:06   [place] EKG SITE RITE  Result Date: 07/10/2023 If Site Rite image not attached, placement could not be confirmed due to current cardiac rhythm.  DG Chest Port 1 View  Result Date: 07/10/2023 CLINICAL DATA:  68 year old female with respiratory failure. EXAM: PORTABLE CHEST 1 VIEW COMPARISON:  Portable chest yesterday and earlier. FINDINGS: Portable AP semi upright views at 0509 hours. Intubated. Endotracheal tube tip in good position between the clavicles and carina. Enteric tube side hole at the level of the stomach. Small volume oral contrast in the stomach. Normal to mildly hyperinflated lung volumes. Allowing for portable technique the lungs are clear. No pneumothorax. No acute osseous abnormality identified. IMPRESSION: 1. Satisfactory ET tube and enteric tube. 2. Large lung volumes with no acute cardiopulmonary abnormality. Electronically Signed   By: Odessa Fleming M.D.   On: 07/10/2023 08:31   DG Abd 1 View  Result Date: 07/09/2023 CLINICAL DATA:  68 year old female with shortness of breath, COPD. Intubated. EXAM: ABDOMEN - 1 VIEW COMPARISON:  Portable chest x-ray at the same time reported separately. FINDINGS: Portable AP supine view at 0641 hours. Enteric tube placed into the epigastrium, stomach. Side hole at the level of the gastric body. Nonobstructed visible bowel gas pattern. Aortoiliac calcified atherosclerosis. IMPRESSION: 1. Satisfactory enteric tube placement into the stomach. 2.  Aortic  Atherosclerosis (ICD10-I70.0). Electronically Signed   By: Odessa Fleming M.D.   On: 07/09/2023 07:07   DG Chest Port 1 View  Result Date: 07/09/2023 CLINICAL DATA:  68 year old female with shortness of breath, COPD. Intubated. EXAM: PORTABLE CHEST 1 VIEW COMPARISON:  Portable chest 0159 hours today and earlier. FINDINGS: Portable AP supine view at 0641 hours. Endotracheal tube tip in good position between the level the clavicles and  carina. Enteric tube courses to the abdomen. Large lung volumes. No pneumothorax or pulmonary edema. No pleural effusion or consolidation but there is coarse left lower lung reticulonodular opacity which appears increased from 07/08/2023. Calcified aortic atherosclerosis. Stable cardiac size and mediastinal contours. Stable visualized osseous structures. IMPRESSION: 1. Satisfactory endotracheal tube. Enteric tube courses to the abdomen. 2. Emphysema (ICD10-J43.9) with acutely increased left lower lung reticulonodular opacity suspicious for acute infectious exacerbation. No pleural effusion. Electronically Signed   By: Odessa Fleming M.D.   On: 07/09/2023 07:06   DG Chest Port 1 View  Result Date: 07/09/2023 CLINICAL DATA:  Dyspnea and respiratory abnormalities. History of COPD, hypertension, former smoker. EXAM: PORTABLE CHEST 1 VIEW COMPARISON:  07/08/2023 FINDINGS: Heart size and pulmonary vascularity are normal. Emphysematous changes in the lungs. Bronchiectasis and peribronchial thickening suggesting chronic bronchitis. No airspace disease or consolidation in the lungs. No pleural effusions. No pneumothorax. Mediastinal contours appear intact. Calcification of the aorta. IMPRESSION: Emphysematous and chronic bronchitic changes in the lungs. No evidence of active pulmonary disease. Electronically Signed   By: Burman Nieves M.D.   On: 07/09/2023 02:56   DG Chest 2 View  Result Date: 07/08/2023 CLINICAL DATA:  SOB, cough EXAM: CHEST - 2 VIEW COMPARISON:  Chest radiograph dated May 23, 2021 FINDINGS: The heart size and mediastinal contours are within normal limits. Aortic atherosclerosis. Hyperinflation with upper lobe predominant emphysematous changes, compatible with history of COPD. No focal consolidation. No pleural effusion or pneumothorax. No acute osseous abnormality. IMPRESSION: Hyperinflation with upper lobe predominant emphysema, compatible with history of COPD. No focal consolidation.  Electronically Signed   By: Hart Robinsons M.D.   On: 07/08/2023 12:35    Microbiology: Recent Results (from the past 240 hour(s))  Resp panel by RT-PCR (RSV, Flu A&B, Covid) Anterior Nasal Swab     Status: None   Collection Time: 07/08/23 11:46 AM   Specimen: Anterior Nasal Swab  Result Value Ref Range Status   SARS Coronavirus 2 by RT PCR NEGATIVE NEGATIVE Final    Comment: (NOTE) SARS-CoV-2 target nucleic acids are NOT DETECTED.  The SARS-CoV-2 RNA is generally detectable in upper respiratory specimens during the acute phase of infection. The lowest concentration of SARS-CoV-2 viral copies this assay can detect is 138 copies/mL. A negative result does not preclude SARS-Cov-2 infection and should not be used as the sole basis for treatment or other patient management decisions. A negative result may occur with  improper specimen collection/handling, submission of specimen other than nasopharyngeal swab, presence of viral mutation(s) within the areas targeted by this assay, and inadequate number of viral copies(<138 copies/mL). A negative result must be combined with clinical observations, patient history, and epidemiological information. The expected result is Negative.  Fact Sheet for Patients:  BloggerCourse.com  Fact Sheet for Healthcare Providers:  SeriousBroker.it  This test is no t yet approved or cleared by the [place] FDA and  has been authorized for detection and/or diagnosis of SARS-CoV-2 by FDA under an Emergency Use Authorization (EUA). This EUA will remain  in effect (meaning this test  can be used) for the duration of the COVID-19 declaration under Section 564(b)(1) of the Act, 21 U.S.C.section 360bbb-3(b)(1), unless the authorization is terminated  or revoked sooner.       Influenza A by PCR NEGATIVE NEGATIVE Final   Influenza B by PCR NEGATIVE NEGATIVE Final    Comment: (NOTE) The Xpert Xpress  SARS-CoV-2/FLU/RSV plus assay is intended as an aid in the diagnosis of influenza from Nasopharyngeal swab specimens and should not be used as a sole basis for treatment. Nasal washings and aspirates are unacceptable for Xpert Xpress SARS-CoV-2/FLU/RSV testing.  Fact Sheet for Patients: BloggerCourse.com  Fact Sheet for Healthcare Providers: SeriousBroker.it  This test is not yet approved or cleared by the [place] FDA and has been authorized for detection and/or diagnosis of SARS-CoV-2 by FDA under an Emergency Use Authorization (EUA). This EUA will remain in effect (meaning this test can be used) for the duration of the COVID-19 declaration under Section 564(b)(1) of the Act, 21 U.S.C. section 360bbb-3(b)(1), unless the authorization is terminated or revoked.     Resp Syncytial Virus by PCR NEGATIVE NEGATIVE Final    Comment: (NOTE) Fact Sheet for Patients: BloggerCourse.com  Fact Sheet for Healthcare Providers: SeriousBroker.it  This test is not yet approved or cleared by the [place] FDA and has been authorized for detection and/or diagnosis of SARS-CoV-2 by FDA under an Emergency Use Authorization (EUA). This EUA will remain in effect (meaning this test can be used) for the duration of the COVID-19 declaration under Section 564(b)(1) of the Act, 21 U.S.C. section 360bbb-3(b)(1), unless the authorization is terminated or revoked.  Performed at [facility], [address] [place], [place] [address]   MRSA Next Gen by PCR, Nasal     Status: None   Collection Time: 07/09/23  2:39 AM   Specimen: Nasal Mucosa; Nasal Swab  Result Value Ref Range Status   MRSA by PCR Next Gen NOT DETECTED NOT DETECTED Final    Comment: (NOTE) The GeneXpert MRSA Assay (FDA approved for NASAL specimens only), is one component of a comprehensive MRSA colonization  surveillance program. It is not intended to diagnose MRSA infection nor to guide or monitor treatment for MRSA infections. Test performance is not FDA approved in patients less than 67 years old. Performed at [facility], [address] [place], [place] [address]   Culture, Respiratory w Gram Stain     Status: None   Collection Time: 07/09/23  9:50 AM   Specimen: Tracheal Aspirate; Respiratory  Result Value Ref Range Status   Specimen Description   Final    TRACHEAL ASPIRATE Performed at [facility], [address] [place], [place] [address]    Special Requests   Final    NONE Performed at [facility], [address] [place], [place] [address]    Gram Stain   Final    RARE WBC PRESENT, PREDOMINANTLY PMN FEW GRAM POSITIVE COCCI IN PAIRS RARE GRAM NEGATIVE RODS    Culture   Final    ABUNDANT HAEMOPHILUS INFLUENZAE BETA LACTAMASE POSITIVE Performed at [facility], [address], [place], [place] [address]    Report Status 07/10/2023 FINAL  Final  Culture, blood (Routine X 2) w Reflex to ID Panel     Status: None   Collection Time: 07/09/23 10:55 AM   Specimen: BLOOD  Result Value Ref Range Status   Specimen Description BLOOD BLOOD LEFT ARM  Final   Special Requests   Final  BOTTLES DRAWN AEROBIC AND ANAEROBIC Blood Culture adequate volume   Culture   Final    NO GROWTH 5 DAYS Performed at [facility], [address] [place], [place] [address]    Report Status 2023-08-10 FINAL  Final  Culture, blood (Routine X 2) w Reflex to ID Panel     Status: None   Collection Time: 07/09/23 11:08 AM   Specimen: BLOOD  Result Value Ref Range Status   Specimen Description BLOOD BLOOD LEFT HAND  Final   Special Requests   Final    BOTTLES DRAWN AEROBIC AND ANAEROBIC Blood Culture results may not be optimal due to an inadequate volume of blood received in culture bottles   Culture   Final    NO GROWTH 5 DAYS Performed at [facility], [address] [place], [place] [address]    Report Status Aug 10, 2023 FINAL  Final  Respiratory (~20 pathogens) panel by PCR     Status: None   Collection Time: 07/10/23  1:17 PM   Specimen: Nasopharyngeal Swab; Respiratory  Result Value Ref Range Status   Adenovirus NOT DETECTED NOT DETECTED Final   Coronavirus 229E NOT DETECTED NOT DETECTED Final    Comment: (NOTE) The Coronavirus on the Respiratory Panel, DOES NOT test for the novel  Coronavirus (2019 nCoV)    Coronavirus HKU1 NOT DETECTED NOT DETECTED Final   Coronavirus NL63 NOT DETECTED NOT DETECTED Final   Coronavirus OC43 NOT DETECTED NOT DETECTED Final   Metapneumovirus NOT DETECTED NOT DETECTED Final   Rhinovirus / Enterovirus NOT DETECTED NOT DETECTED Final   Influenza A NOT DETECTED NOT DETECTED Final   Influenza B NOT DETECTED NOT DETECTED Final   Parainfluenza Virus 1 NOT DETECTED NOT DETECTED Final   Parainfluenza Virus 2 NOT DETECTED NOT DETECTED Final   Parainfluenza Virus 3 NOT DETECTED NOT DETECTED Final   Parainfluenza Virus 4 NOT DETECTED NOT DETECTED Final   Respiratory Syncytial Virus NOT DETECTED NOT DETECTED Final   Bordetella pertussis NOT DETECTED NOT DETECTED Final   Bordetella Parapertussis NOT DETECTED NOT DETECTED Final   Chlamydophila pneumoniae NOT DETECTED NOT DETECTED Final   Mycoplasma pneumoniae NOT DETECTED NOT DETECTED Final    Comment: Performed at [facility], [address], [place], [place] [address]      Signed: Alford Highland, MD Note written on 08-12-23, Patient passed away 10-Aug-2023. Non-billable note

## 2023-08-06 NOTE — Progress Notes (Signed)
    OVERNIGHT PROGRESS REPORT  Notified by RN that patient is deceased as of 08-07-2023 Aug 12, 2234  Patient was comfort care  2 RN verified.  Family being notified by RN.     Seini Lannom Lamin Geradine Girt, MSN, APRN, AGACNP-BC [facility] [place] Pager: [phone]. Check Amion for Availability

## 2023-08-06 NOTE — Progress Notes (Signed)
Pt expired at 2235. Called all 3 (son,daughter,spouse) with no answer. Will cont to try.

## 2023-08-06 NOTE — Progress Notes (Signed)
Progress Note   Patient: Emily Bishop UVO:[id number] DOB: [birth year] DOA: 07/08/2023     6 DOS: the patient was seen and examined on 08/04/2023   Brief hospital course: 68 year old female presenting to [facility] ED from home via EMS on 07/08/2023 for evaluation of worsening shortness of breath.   History provided per chart review and spouse bedside report as patient is unable to participate in interview at this time. Patient was in her normal state of health until roughly 3 days ago on 07/05/2023 when she developed some sinus congestion and shortness of breath.  Over these 3 days she has had progressive dyspnea, wheezing and spouse reports dry nonproductive cough.  Of note patient reported on arrival a productive cough with yellowish sputum. She denied chest pain on arrival, as well as denying nausea/ vomiting/ diarrhea/ abdominal pain.  Spouse denies any fever or chills stating he checked her temperature and she was about 99 degrees.  She also reported using her albuterol rescue inhaler around-the-clock with minimal help. They do not have the capability to check her SpO2 at home and the patient does not utilize home oxygen during the day or at night.  Spouse reports her baseline as " lying on the couch".  When asked if she is able to take part in activity without shortness of breath he responded that he takes care of all the cleaning and cooking and that she does not have to do any activities. He reports that her PO diet has remained good and she has been eating and drinking well. He also reported at baseline she sleeps sitting up in a recliner chair. He states she is a former cigarette smoker, switching to vaping 3-4 years ago after she was diagnosed with COPD.  He is unclear of how much vaping the patient does during the day.  She has a history of EtOH abuse, however has reported cutting back and has been confirms she has 2-6 beers a day but has abstained since Friday, 07/05/2023 due to not feeling well.   He denies any withdrawal symptoms in the past when she has abstained from drinking EtOH.  He denies any recreational drug use. She was last seen by pulmonology outpatient in August 2023 at that time she was evaluated for home oxygen and was not a candidate.  ED course: Upon arrival patient alert and awake diminished breath sounds and expiratory wheezing.  SpO2 low to mid 90s on room air, temperature 99.6 with tachycardia and hypertension. COVID/flu/RSV negative, chest x-ray negative for acute infiltrate.  Labs significant for hypokalemia, NAGMA, hypocalcemia & hypoalbuminemia. TRH consulted for admission to inpatient.  Hospital course: Overnight patient with worsening shortness of breath, tripoding, anxious in respiratory distress.  She was given several DuoNeb treatments and Ativan, placed on BiPAP for support and transferred to the ICU. PCCM consulted for assistance in management and monitoring due to acute hypoxic respiratory failure secondary to AECOPD with a high risk for intubation and mechanical ventilatory support.  Patient was made comfort care measures on 12/6 and started on morphine drip.  Patient seen on 12/7 and family was initially interested in hospice home but changed their mind to have continue treatment in the hospital.  12/8.  Patient on morphine drip 10 mg/h and as needed medications for comfort care.  Spoke with family at the bedside and answered questions. 12/9.  This morning was on Dilaudid drip 1.5 mg/h.  Continue comfort care measures.  Assessment and Plan: * End of life care Patient was made  comfort care on 12/6 and started on morphine drip.  Patient is unresponsive on morphine drip.  As needed glycopyrrolate needed to be given for upper airway congestion and gurgling.  Acute on chronic respiratory failure with hypoxia and hypercapnia (HCC) Since patient made comfort care measures, oxygen as needed for comfort.  Last pulse ox in the 70s.  COPD exacerbation  (HCC) Initially patient was started on antibiotics and Solu-Medrol and given nebulizer treatments.  Haemophilus influenzae growing out of sputum culture.  Now comfort care measures.  Metabolic acidosis On admission  SIRS (systemic inflammatory response syndrome) (HCC) Present on admission with COPD exacerbation.  Acute metabolic encephalopathy Patient had altered mental status before morphine drip started.  Electrolyte abnormality Numerous electrolyte abnormalities with hyponatremia, hypokalemia than hyperkalemia, hypocalcemia.        Subjective: Patient unresponsive to sternal rub.  Admitted initially for shortness of breath and COPD exacerbation.  On comfort care measures.  Physical Exam: Vitals:   07/26/2023 1015 07/27/2023 1100 07/17/2023 1150 07/07/2023 1200  BP:  (!) 73/61 (!) 83/57 (!) 86/53  Pulse: (!) 139 (!) 136 (!) 139 (!) 141  Resp: (!) 24 17 (!) 25 17  Temp:      TempSrc:      SpO2: (!) 51% (!) 63% (!) 67% (!) 71%  Weight:      Height:       Physical Exam Eyes:     Comments: Pupils sluggish  Cardiovascular:     Rate and Rhythm: Regular rhythm. Tachycardia present.     Heart sounds: Normal heart sounds, S1 normal and S2 normal.  Pulmonary:     Breath sounds: Transmitted upper airway sounds present. Examination of the right-lower field reveals decreased breath sounds and rhonchi. Examination of the left-lower field reveals decreased breath sounds and rhonchi. Decreased breath sounds and rhonchi present. No wheezing or rales.  Abdominal:     Palpations: Abdomen is soft.     Tenderness: There is no abdominal tenderness.  Musculoskeletal:     Right lower leg: No swelling.     Left lower leg: No swelling.  Skin:    General: Skin is warm.  Neurological:     Comments: Unresponsive to sternal rub     Data Reviewed: No new data  Disposition: Status is: Inpatient Remains inpatient appropriate because: Patient on Dilaudid drip for comfort care measures and  end-of-life care  Planned Discharge Destination: Patient will remain here in the hospital    Time spent: 22 minutes  Author: Alford Highland, MD 07/06/2023 1:03 PM  For on call review [email or web address].

## 2023-08-06 NOTE — Assessment & Plan Note (Signed)
Blood pressures in the 80s.

## 2023-08-06 DEATH — deceased
